# Patient Record
Sex: Male | Born: 1955 | Race: White | Hispanic: No | Marital: Married | Smoking: Never smoker
Health system: Southern US, Community
[De-identification: ages and names within clinical notes are randomized; demographics above are authoritative.]

## PROBLEM LIST (undated history)

## (undated) DIAGNOSIS — I480 Paroxysmal atrial fibrillation: Secondary | ICD-10-CM

## (undated) DIAGNOSIS — G4730 Sleep apnea, unspecified: Secondary | ICD-10-CM

## (undated) DIAGNOSIS — B019 Varicella without complication: Secondary | ICD-10-CM

## (undated) DIAGNOSIS — R972 Elevated prostate specific antigen [PSA]: Secondary | ICD-10-CM

## (undated) DIAGNOSIS — E119 Type 2 diabetes mellitus without complications: Secondary | ICD-10-CM

## (undated) DIAGNOSIS — C801 Malignant (primary) neoplasm, unspecified: Secondary | ICD-10-CM

## (undated) DIAGNOSIS — I10 Essential (primary) hypertension: Secondary | ICD-10-CM

## (undated) DIAGNOSIS — K219 Gastro-esophageal reflux disease without esophagitis: Secondary | ICD-10-CM

## (undated) DIAGNOSIS — E785 Hyperlipidemia, unspecified: Secondary | ICD-10-CM

## (undated) DIAGNOSIS — I4891 Unspecified atrial fibrillation: Secondary | ICD-10-CM

## (undated) HISTORY — PX: PROSTATE BIOPSY: SHX241

## (undated) HISTORY — DX: Paroxysmal atrial fibrillation: I48.0

## (undated) HISTORY — DX: Essential (primary) hypertension: I10

## (undated) HISTORY — DX: Gastro-esophageal reflux disease without esophagitis: K21.9

## (undated) HISTORY — DX: Hyperlipidemia, unspecified: E78.5

## (undated) HISTORY — DX: Varicella without complication: B01.9

## (undated) HISTORY — PX: HERNIA REPAIR: SHX51

## (undated) HISTORY — PX: APPENDECTOMY: SHX54

## (undated) HISTORY — DX: Type 2 diabetes mellitus without complications: E11.9

## (undated) HISTORY — DX: Unspecified atrial fibrillation: I48.91

## (undated) HISTORY — DX: Elevated prostate specific antigen (PSA): R97.20

---

## 2011-02-20 ENCOUNTER — Ambulatory Visit: Payer: Self-pay

## 2012-11-28 ENCOUNTER — Inpatient Hospital Stay: Payer: Self-pay | Admitting: Internal Medicine

## 2012-11-28 LAB — BASIC METABOLIC PANEL
Anion Gap: 7 (ref 7–16)
Calcium, Total: 9.5 mg/dL (ref 8.5–10.1)
Chloride: 104 mmol/L (ref 98–107)
Co2: 23 mmol/L (ref 21–32)
EGFR (African American): >60
EGFR (Non-African Amer.): >60
Glucose: 394 mg/dL — ABNORMAL HIGH (ref 65–99)
Osmolality: 288 (ref 275–301)
Potassium: 4.9 mmol/L (ref 3.5–5.1)

## 2012-11-28 LAB — CBC
HCT: 47.7 % (ref 40.0–52.0)
MCH: 31.6 pg (ref 26.0–34.0)
RBC: 5.19 10*6/uL (ref 4.40–5.90)
RDW: 13.7 % (ref 11.5–14.5)

## 2012-11-28 LAB — URINALYSIS, COMPLETE
Blood: NEGATIVE
Nitrite: NEGATIVE
Ph: 5 (ref 4.5–8.0)
Protein: NEGATIVE
RBC,UR: 1 /HPF (ref 0–5)
Squamous Epithelial: NONE SEEN

## 2012-11-28 LAB — PROTIME-INR
INR: 1
Prothrombin Time: 13.1 secs (ref 11.5–14.7)

## 2012-11-28 LAB — RAPID INFLUENZA A&B ANTIGENS

## 2012-11-28 LAB — DRUG SCREEN, URINE
Amphetamines, Ur Screen: NEGATIVE (ref ?–1000)
Barbiturates, Ur Screen: NEGATIVE (ref ?–200)
Cocaine Metabolite,Ur ~~LOC~~: NEGATIVE (ref ?–300)
MDMA (Ecstasy)Ur Screen: NEGATIVE (ref ?–500)
Methadone, Ur Screen: NEGATIVE (ref ?–300)
Opiate, Ur Screen: POSITIVE (ref ?–300)
Phencyclidine (PCP) Ur S: NEGATIVE (ref ?–25)

## 2012-11-28 LAB — TROPONIN I: Troponin-I: <0.02 ng/mL

## 2012-11-28 LAB — TSH: Thyroid Stimulating Horm: 2.58 u[IU]/mL

## 2012-11-29 DIAGNOSIS — I4891 Unspecified atrial fibrillation: Secondary | ICD-10-CM

## 2012-11-29 DIAGNOSIS — I059 Rheumatic mitral valve disease, unspecified: Secondary | ICD-10-CM

## 2012-11-29 LAB — COMPREHENSIVE METABOLIC PANEL
Albumin: 3.3 g/dL — ABNORMAL LOW (ref 3.4–5.0)
Alkaline Phosphatase: 75 U/L (ref 50–136)
BUN: 20 mg/dL — ABNORMAL HIGH (ref 7–18)
Bilirubin,Total: 1 mg/dL (ref 0.2–1.0)
Creatinine: 0.84 mg/dL (ref 0.60–1.30)
Potassium: 3.8 mmol/L (ref 3.5–5.1)
SGOT(AST): 59 U/L — ABNORMAL HIGH (ref 15–37)
SGPT (ALT): 101 U/L — ABNORMAL HIGH (ref 12–78)
Total Protein: 6.1 g/dL — ABNORMAL LOW (ref 6.4–8.2)

## 2012-11-29 LAB — CBC WITH DIFFERENTIAL/PLATELET
Basophil #: 0.1 10*3/uL (ref 0.0–0.1)
Basophil %: 0.5 %
Eosinophil #: 0.2 10*3/uL (ref 0.0–0.7)
Lymphocyte %: 35.4 %
MCH: 31.7 pg (ref 26.0–34.0)
MCV: 91 fL (ref 80–100)
Monocyte #: 0.7 x10 3/mm (ref 0.2–1.0)
Monocyte %: 6.1 %
Neutrophil #: 6.9 10*3/uL — ABNORMAL HIGH (ref 1.4–6.5)
Neutrophil %: 56.3 %
Platelet: 261 10*3/uL (ref 150–440)
RDW: 14 % (ref 11.5–14.5)
WBC: 12.2 10*3/uL — ABNORMAL HIGH (ref 3.8–10.6)

## 2012-11-29 LAB — TROPONIN I: Troponin-I: <0.02 ng/mL

## 2012-11-29 LAB — LIPID PANEL
Cholesterol: 150 mg/dL (ref 0–200)
HDL Cholesterol: 35 mg/dL — ABNORMAL LOW (ref 40–60)
VLDL Cholesterol, Calc: 28 mg/dL (ref 5–40)

## 2012-12-03 LAB — CULTURE, BLOOD (SINGLE)

## 2012-12-05 ENCOUNTER — Ambulatory Visit (INDEPENDENT_AMBULATORY_CARE_PROVIDER_SITE_OTHER): Payer: 59 | Admitting: Cardiovascular Disease

## 2012-12-05 ENCOUNTER — Encounter: Payer: Self-pay | Admitting: Cardiovascular Disease

## 2012-12-05 VITALS — BP 122/86 | HR 75 | Ht 72.0 in | Wt 236.8 lb

## 2012-12-05 DIAGNOSIS — IMO0001 Reserved for inherently not codable concepts without codable children: Secondary | ICD-10-CM

## 2012-12-05 DIAGNOSIS — I4891 Unspecified atrial fibrillation: Secondary | ICD-10-CM

## 2012-12-05 DIAGNOSIS — E119 Type 2 diabetes mellitus without complications: Secondary | ICD-10-CM | POA: Insufficient documentation

## 2012-12-05 DIAGNOSIS — R002 Palpitations: Secondary | ICD-10-CM

## 2012-12-05 DIAGNOSIS — G4730 Sleep apnea, unspecified: Secondary | ICD-10-CM

## 2012-12-05 DIAGNOSIS — E669 Obesity, unspecified: Secondary | ICD-10-CM

## 2012-12-05 DIAGNOSIS — F1010 Alcohol abuse, uncomplicated: Secondary | ICD-10-CM

## 2012-12-05 MED ORDER — APIXABAN 5 MG PO TABS: 5.0000 mg | ORAL_TABLET | Freq: Two times a day (BID) | ORAL | Status: DC

## 2012-12-05 MED ORDER — GLUCOSE BLOOD VI STRP: ORAL_STRIP | Status: DC

## 2012-12-05 MED ORDER — METOPROLOL TARTRATE 25 MG PO TABS: 25.0000 mg | ORAL_TABLET | Freq: Two times a day (BID) | ORAL | Status: DC

## 2012-12-05 NOTE — Assessment & Plan Note (Signed)
He has changed his lifestyle since his discharge. Reports his sugars have improved

## 2012-12-05 NOTE — Assessment & Plan Note (Signed)
He has significantly changed his diet. Encouraged him to stay on metformin. Followup with primary care physician

## 2012-12-05 NOTE — Progress Notes (Signed)
Patient ID: Brandon [name], male    DOB: [birth year], 57 y.o.   MRN: [id number]  HPI Comments: [name] is a very pleasant 57 year old gentleman presents for followup after recent hospitalization 11/29/2012. History of alcohol, obesity, suspected sleep apnea, presenting with atrial fibrillation. He reported 2 episodes of palpitations and tachycardia with lightheadedness over the previous month. Magnesium was low on arrival. He was started on metoprolol tartrate, anticoagulation with eliquis. Also diagnosed with diabetes type 2 with hemoglobin A1c 10.8. He converted to normal sinus rhythm during his hospital course  Echocardiogram showed ejection fraction 60-65%, normal RV function, mild MR, moderately elevated right ventricular systolic pressures. Liver enzymes were mildly elevated. Glucose 236  Total cholesterol 150, LDL 87, HDL 35  In followup today, he reports that he is doing well but is a little bit groggy. He continues to have problems with snoring, apnea per his wife. Not sleeping well. Uncertain if the medications are making him tired. He denies any tachycardia or palpitations concerning for recurrent atrial fibrillation  EKG today shows normal sinus rhythm with no significant ST or T wave changes    Outpatient Encounter Prescriptions as of 12/05/2012  Medication Sig Dispense Refill  . apixaban (ELIQUIS) 5 MG TABS tablet Take 1 tablet (5 mg total) by mouth 2 (two) times daily.  60 tablet  6  . Ascorbic Acid (VITAMIN C) 1000 MG tablet Take 1,000 mg by mouth daily.      . metFORMIN (GLUCOPHAGE) 500 MG tablet Take 500 mg by mouth 2 (two) times daily with a meal.      . metoprolol tartrate (LOPRESSOR) 25 MG tablet Take 1 tablet (25 mg total) by mouth 2 (two) times daily.  180 tablet  3  . Multiple Vitamins-Minerals (MEGA BASIC PO) Take by mouth.      . ranitidine (ZANTAC) 75 MG tablet Take 75 mg by mouth 2 (two) times daily.      Marland Kitchen glucose blood (ACCU-CHEK ACTIVE STRIPS) test strip Use as  instructed  100 each  12    Review of Systems  Constitutional: Negative.   HENT: Negative.   Eyes: Negative.   Respiratory: Negative.   Cardiovascular: Negative.   Gastrointestinal: Negative.   Endocrine: Negative.   Musculoskeletal: Negative.   Skin: Negative.   Allergic/Immunologic: Negative.   Neurological: Negative.   Hematological: Negative.   Psychiatric/Behavioral: Negative.   All other systems reviewed and are negative.    BP 122/86  Pulse 75  Ht 6' (1.829 m)  Wt 236 lb 12 oz (107.389 kg)  BMI 32.1 kg/m2   Physical Exam  Nursing note and vitals reviewed. Constitutional: He is oriented to person, place, and time. He appears well-developed and well-nourished.  HENT:  Head: Normocephalic.  Nose: Nose normal.  Mouth/Throat: Oropharynx is clear and moist.  Eyes: Conjunctivae are normal. Pupils are equal, round, and reactive to light.  Neck: Normal range of motion. Neck supple. No JVD present.  Cardiovascular: Normal rate, regular rhythm, S1 normal, S2 normal, normal heart sounds and intact distal pulses.  Exam reveals no gallop and no friction rub.   No murmur heard. Pulmonary/Chest: Effort normal and breath sounds normal. No respiratory distress. He has no wheezes. He has no rales. He exhibits no tenderness.  Abdominal: Soft. Bowel sounds are normal. He exhibits no distension. There is no tenderness.  Musculoskeletal: Normal range of motion. He exhibits no edema and no tenderness.  Lymphadenopathy:    He has no cervical adenopathy.  Neurological: He is  alert and oriented to person, place, and time. Coordination normal.  Skin: Skin is warm and dry. No rash noted. No erythema.  Psychiatric: He has a normal mood and affect. His behavior is normal. Judgment and thought content normal.      Assessment and Plan

## 2012-12-05 NOTE — Patient Instructions (Addendum)
You are doing well. No medication changes were made.  If you get a fast rhythm,  Take an extra metoprolol and call the office   Please call us if you have new issues that need to be addressed before your next appt.  Your physician wants you to follow-up in: 6 months.  You will receive a reminder letter in the mail two months in advance. If you don't receive a letter, please call our office to schedule the follow-up appointment.

## 2012-12-05 NOTE — Assessment & Plan Note (Signed)
Maintaining normal sinus rhythm. He is high risk of converting back to atrial fibrillation giving uncontrolled sleep apnea. We will help schedule a sleep study. Encouraged him to stay on anticoagulation and metoprolol twice a day

## 2012-12-05 NOTE — Assessment & Plan Note (Signed)
We recommended he obtain from alcohol as this would be a risk factor for further atrial fibrillation

## 2012-12-05 NOTE — Assessment & Plan Note (Signed)
We will help to schedule a sleep study. He also needs primary care physician

## 2013-04-24 ENCOUNTER — Other Ambulatory Visit: Payer: Self-pay | Admitting: Cardiovascular Disease

## 2013-05-31 ENCOUNTER — Encounter: Payer: Self-pay | Admitting: Cardiovascular Disease

## 2013-05-31 ENCOUNTER — Ambulatory Visit (INDEPENDENT_AMBULATORY_CARE_PROVIDER_SITE_OTHER): Payer: 59 | Admitting: Cardiovascular Disease

## 2013-05-31 ENCOUNTER — Ambulatory Visit: Payer: 59 | Admitting: Cardiovascular Disease

## 2013-05-31 VITALS — BP 120/76 | HR 73 | Ht 72.0 in | Wt 223.5 lb

## 2013-05-31 DIAGNOSIS — E669 Obesity, unspecified: Secondary | ICD-10-CM

## 2013-05-31 DIAGNOSIS — N521 Erectile dysfunction due to diseases classified elsewhere: Secondary | ICD-10-CM

## 2013-05-31 DIAGNOSIS — I4902 Ventricular flutter: Secondary | ICD-10-CM

## 2013-05-31 DIAGNOSIS — F1010 Alcohol abuse, uncomplicated: Secondary | ICD-10-CM

## 2013-05-31 DIAGNOSIS — I498 Other specified cardiac arrhythmias: Secondary | ICD-10-CM

## 2013-05-31 DIAGNOSIS — N529 Male erectile dysfunction, unspecified: Secondary | ICD-10-CM

## 2013-05-31 DIAGNOSIS — G4730 Sleep apnea, unspecified: Secondary | ICD-10-CM

## 2013-05-31 DIAGNOSIS — I4891 Unspecified atrial fibrillation: Secondary | ICD-10-CM

## 2013-05-31 DIAGNOSIS — IMO0001 Reserved for inherently not codable concepts without codable children: Secondary | ICD-10-CM

## 2013-05-31 DIAGNOSIS — E119 Type 2 diabetes mellitus without complications: Secondary | ICD-10-CM | POA: Insufficient documentation

## 2013-05-31 DIAGNOSIS — E1165 Type 2 diabetes mellitus with hyperglycemia: Secondary | ICD-10-CM

## 2013-05-31 DIAGNOSIS — E1169 Type 2 diabetes mellitus with other specified complication: Secondary | ICD-10-CM

## 2013-05-31 MED ORDER — APIXABAN 5 MG PO TABS: 5.0000 mg | ORAL_TABLET | Freq: Two times a day (BID) | ORAL | Status: DC

## 2013-05-31 MED ORDER — ENALAPRIL MALEATE 5 MG PO TABS: 5.0000 mg | ORAL_TABLET | Freq: Every day | ORAL | Status: DC

## 2013-05-31 MED ORDER — TADALAFIL 5 MG PO TABS: 5.0000 mg | ORAL_TABLET | Freq: Every day | ORAL | Status: DC | PRN

## 2013-05-31 NOTE — Progress Notes (Signed)
Patient ID: Brandon [name], male    DOB: 11/26/1955, 58 y.o.   MRN: [id number]  HPI Comments: [name] is a very pleasant 58 year old gentleman with a history of atrial fibrillation, presents for followup     hospitalization 11/29/2012. History of alcohol, obesity, suspected sleep apnea, presenting with atrial fibrillation.  Magnesium was low on arrival. He was started on metoprolol tartrate, anticoagulation with eliquis. Also diagnosed with diabetes type 2 with hemoglobin A1c 10.8. He converted to normal sinus rhythm during his hospital course  Echocardiogram showed ejection fraction 60-65%, normal RV function, mild MR, moderately elevated right ventricular systolic pressures. Liver enzymes were mildly elevated. Glucose 236  Total cholesterol 150, LDL 87, HDL 35  In followup since that time, he has lost significant weight, eating better, started on metformin. He reports hemoglobin A1c 6.2 He denies any tachycardia or palpitations concerning for atrial fibrillation. He is taking metoprolol 12.5 mg twice a day He is concerned with recent symptoms of erectile dysfunction Otherwise feels well with good energy and no other complaints,  EKG today shows normal sinus rhythm with rate 73 beats per minute, no significant ST or T wave changes    Outpatient Encounter Prescriptions as of 05/31/2013  Medication Sig  . apixaban (ELIQUIS) 5 MG TABS tablet Take 1 tablet (5 mg total) by mouth 2 (two) times daily.  . Ascorbic Acid (VITAMIN C) 1000 MG tablet Take 1,000 mg by mouth daily.  Marland Kitchen glucose blood (ACCU-CHEK ACTIVE STRIPS) test strip Use as instructed  . metFORMIN (GLUCOPHAGE) 500 MG tablet Take 500 mg by mouth 2 (two) times daily with a meal.  . metoprolol tartrate (LOPRESSOR) 25 MG tablet TAKE 1 TABLET BY MOUTH TWICE DAILY  . Multiple Vitamins-Minerals (MEGA BASIC PO) Take by mouth.  . ranitidine (ZANTAC) 75 MG tablet Take 75 mg by mouth 2 (two) times daily.    Review of Systems  Constitutional:  Negative.   HENT: Negative.   Eyes: Negative.   Respiratory: Negative.   Cardiovascular: Negative.   Gastrointestinal: Negative.   Endocrine: Negative.   Musculoskeletal: Negative.   Skin: Negative.   Allergic/Immunologic: Negative.   Neurological: Negative.   Hematological: Negative.   Psychiatric/Behavioral: Negative.   All other systems reviewed and are negative.    BP 120/76  Pulse 73  Ht 6' (1.829 m)  Wt 223 lb 8 oz (101.379 kg)  BMI 30.31 kg/m2  Physical Exam  Nursing note and vitals reviewed. Constitutional: He is oriented to person, place, and time. He appears well-developed and well-nourished.  HENT:  Head: Normocephalic.  Nose: Nose normal.  Mouth/Throat: Oropharynx is clear and moist.  Eyes: Conjunctivae are normal. Pupils are equal, round, and reactive to light.  Neck: Normal range of motion. Neck supple. No JVD present.  Cardiovascular: Normal rate, regular rhythm, S1 normal, S2 normal, normal heart sounds and intact distal pulses.  Exam reveals no gallop and no friction rub.   No murmur heard. Pulmonary/Chest: Effort normal and breath sounds normal. No respiratory distress. He has no wheezes. He has no rales. He exhibits no tenderness.  Abdominal: Soft. Bowel sounds are normal. He exhibits no distension. There is no tenderness.  Musculoskeletal: Normal range of motion. He exhibits no edema and no tenderness.  Lymphadenopathy:    He has no cervical adenopathy.  Neurological: He is alert and oriented to person, place, and time. Coordination normal.  Skin: Skin is warm and dry. No rash noted. No erythema.  Psychiatric: He has a normal mood and  affect. His behavior is normal. Judgment and thought content normal.      Assessment and Plan

## 2013-05-31 NOTE — Assessment & Plan Note (Signed)
Complemented him on his recent weight loss

## 2013-05-31 NOTE — Patient Instructions (Signed)
You are doing well.  Please start enalapril 5 mg daily  Please call us if you have new issues that need to be addressed before your next appt.  Your physician wants you to follow-up in: 6 months.  You will receive a reminder letter in the mail two months in advance. If you don't receive a letter, please call our office to schedule the follow-up appointment.

## 2013-05-31 NOTE — Assessment & Plan Note (Signed)
Appears to be maintaining normal sinus rhythm. No medication changes made at this time. We did discuss potentially coming off anticoagulation in the future. We'll continue current medications for now

## 2013-05-31 NOTE — Assessment & Plan Note (Signed)
Sample of Cialis given given a rectal dysfunction symptoms.

## 2013-05-31 NOTE — Assessment & Plan Note (Signed)
Alcohol abuse was discussed with him today. This was a prior issue

## 2013-05-31 NOTE — Assessment & Plan Note (Addendum)
Diabetes numbers much improved. Encouraged him to continue his current plan Enalapril 5 mg daily has been added for his diabetes

## 2013-05-31 NOTE — Assessment & Plan Note (Signed)
He is currently wearing his CPAP and sleeping much better

## 2013-07-21 ENCOUNTER — Other Ambulatory Visit: Payer: Self-pay | Admitting: Cardiovascular Disease

## 2013-08-01 DIAGNOSIS — K219 Gastro-esophageal reflux disease without esophagitis: Secondary | ICD-10-CM | POA: Insufficient documentation

## 2013-08-01 DIAGNOSIS — I10 Essential (primary) hypertension: Secondary | ICD-10-CM | POA: Insufficient documentation

## 2013-10-17 ENCOUNTER — Ambulatory Visit: Payer: Self-pay | Admitting: Urology

## 2014-01-27 ENCOUNTER — Ambulatory Visit: Payer: 59 | Admitting: Cardiovascular Disease

## 2014-02-14 ENCOUNTER — Ambulatory Visit (INDEPENDENT_AMBULATORY_CARE_PROVIDER_SITE_OTHER): Payer: 59 | Admitting: Cardiovascular Disease

## 2014-02-14 ENCOUNTER — Encounter: Payer: Self-pay | Admitting: Cardiovascular Disease

## 2014-02-14 VITALS — BP 106/68 | HR 75 | Ht 72.0 in | Wt 220.5 lb

## 2014-02-14 DIAGNOSIS — I4891 Unspecified atrial fibrillation: Secondary | ICD-10-CM

## 2014-02-14 DIAGNOSIS — IMO0001 Reserved for inherently not codable concepts without codable children: Secondary | ICD-10-CM

## 2014-02-14 DIAGNOSIS — R42 Dizziness and giddiness: Secondary | ICD-10-CM | POA: Insufficient documentation

## 2014-02-14 DIAGNOSIS — G4730 Sleep apnea, unspecified: Secondary | ICD-10-CM

## 2014-02-14 DIAGNOSIS — E1165 Type 2 diabetes mellitus with hyperglycemia: Secondary | ICD-10-CM

## 2014-02-14 DIAGNOSIS — E669 Obesity, unspecified: Secondary | ICD-10-CM

## 2014-02-14 DIAGNOSIS — F1010 Alcohol abuse, uncomplicated: Secondary | ICD-10-CM

## 2014-02-14 NOTE — Assessment & Plan Note (Signed)
Rare episodes of dizziness. Blood pressure is low on today's visit. I suspect his symptoms are secondary to orthostasis. Recommended he liberalize his salt and fluid intake. If symptoms persist, may need to move the enalapril to the evening workup the pill in half

## 2014-02-14 NOTE — Assessment & Plan Note (Signed)
Dramatic improvement in his weight since 2014.

## 2014-02-14 NOTE — Patient Instructions (Signed)
You are doing well. No medication changes were made.  Try to hydrate, don't avoid salt (pressure is low) Ok to move enalapril to the PM Stay on eliquis and low dose metoprolol (pm)  If you have worsening dizziness, call the office  Please call us if you have new issues that need to be addressed before your next appt.  Your physician wants you to follow-up in: 6 months.  You will receive a reminder letter in the mail two months in advance. If you don't receive a letter, please call our office to schedule the follow-up appointment.

## 2014-02-14 NOTE — Assessment & Plan Note (Signed)
No symptoms concerning for atrial fibrillation. He will continue on metoprolol in the evening, anticoagulation

## 2014-02-14 NOTE — Assessment & Plan Note (Signed)
Currently compliant with his CPAP

## 2014-02-14 NOTE — Assessment & Plan Note (Signed)
Diabetes is well controlled. Suggested he continue with his exercise, weight loss program Managed by primary care

## 2014-02-14 NOTE — Assessment & Plan Note (Signed)
Recommended moderation with alcohol

## 2014-02-14 NOTE — Progress Notes (Signed)
Patient ID: Brandon [name], male    DOB: 1955-06-23, 58 y.o.   MRN: [id number]  HPI Comments: Brandon [name] is a very pleasant 58 year old gentleman with a history of atrial fibrillation, presents for followup of his atrial fibrillation He has a history of sleep apnea, wears CPAP on a regular basis  In follow-up today, he reports that he has been doing well, denies having any palpitations concerning for atrial fibrillation. He had significant fatigue and stopped his morning metoprolol, now takes metoprolol 2.5 mg in the p.m. and feels much better. Occasionally has lightheadedness when he stands up quickly. He has been avoiding salt, does not drink much during the daytime. Labs reviewed with him showing Total cholesterol 188, hemoglobin A1c 6.1  feels well with good energy and no other complaints, Weight continues to slowly trend lower  EKG on today's visit shows normal sinus rhythm with rate 75 bpm, no significant ST or T-wave changes  Prior medical history  hospitalization 11/29/2012. History of alcohol, obesity, suspected sleep apnea, presenting with atrial fibrillation.  Magnesium was low on arrival. He was started on metoprolol tartrate, anticoagulation with eliquis. Also diagnosed with diabetes type 2 with hemoglobin A1c 10.8. He converted to normal sinus rhythm during his hospital course  Echocardiogram showed ejection fraction 60-65%, normal RV function, mild MR, moderately elevated right ventricular systolic pressures. Liver enzymes were mildly elevated. Glucose 236   Allergies  Allergen Reactions  . Penicillin G Anaphylaxis  . Penicillins     Outpatient Encounter Prescriptions as of 02/14/2014  Medication Sig  . apixaban (ELIQUIS) 5 MG TABS tablet Take 1 tablet (5 mg total) by mouth 2 (two) times daily.  . Ascorbic Acid (VITAMIN C) 1000 MG tablet Take 1,000 mg by mouth daily.  Marland Kitchen ELIQUIS 5 MG TABS tablet TAKE 1 TABLET BY MOUTH TWICE DAILY  . enalapril (VASOTEC) 5 MG tablet Take  1 tablet (5 mg total) by mouth daily.  Marland Kitchen glucose blood (ACCU-CHEK ACTIVE STRIPS) test strip Use as instructed  . metFORMIN (GLUCOPHAGE) 500 MG tablet Take 500 mg by mouth 2 (two) times daily with a meal.  . metoprolol tartrate (LOPRESSOR) 25 MG tablet TAKE 1 TABLET BY MOUTH TWICE DAILY  . Multiple Vitamins-Minerals (MEGA BASIC PO) Take by mouth.  . ranitidine (ZANTAC) 75 MG tablet Take 75 mg by mouth 2 (two) times daily.  . tadalafil (CIALIS) 5 MG tablet Take 1 tablet (5 mg total) by mouth daily as needed for erectile dysfunction.    Past Medical History  Diagnosis Date  . GERD (gastroesophageal reflux disease)   . Type 2 diabetes mellitus   . A-fib   . Hypertension     Past Surgical History  Procedure Laterality Date  . Appendectomy    . Hernia repair      Social History  reports that he has quit smoking. He does not have any smokeless tobacco history on file. He reports that he drinks alcohol. He reports that he does not use illicit drugs.  Family History Family history is unknown by patient.     Review of Systems  Constitutional: Negative.   HENT: Negative.   Eyes: Negative.   Respiratory: Negative.   Cardiovascular: Negative.   Gastrointestinal: Negative.   Endocrine: Negative.   Musculoskeletal: Negative.   Skin: Negative.   Allergic/Immunologic: Negative.   Neurological: Positive for dizziness.  Hematological: Negative.   Psychiatric/Behavioral: Negative.   All other systems reviewed and are negative.   BP 106/68 mmHg  Pulse 75  Ht 6' (1.829 m)  Wt 220 lb 8 oz (100.018 kg)  BMI 29.90 kg/m2  Physical Exam  Constitutional: He is oriented to person, place, and time. He appears well-developed and well-nourished.  HENT:  Head: Normocephalic.  Nose: Nose normal.  Mouth/Throat: Oropharynx is clear and moist.  Eyes: Conjunctivae are normal. Pupils are equal, round, and reactive to light.  Neck: Normal range of motion. Neck supple. No JVD present.   Cardiovascular: Normal rate, regular rhythm, S1 normal, S2 normal, normal heart sounds and intact distal pulses.  Exam reveals no gallop and no friction rub.   No murmur heard. Pulmonary/Chest: Effort normal and breath sounds normal. No respiratory distress. He has no wheezes. He has no rales. He exhibits no tenderness.  Abdominal: Soft. Bowel sounds are normal. He exhibits no distension. There is no tenderness.  Musculoskeletal: Normal range of motion. He exhibits no edema or tenderness.  Lymphadenopathy:    He has no cervical adenopathy.  Neurological: He is alert and oriented to person, place, and time. Coordination normal.  Skin: Skin is warm and dry. No rash noted. No erythema.  Psychiatric: He has a normal mood and affect. His behavior is normal. Judgment and thought content normal.      Assessment and Plan   Nursing note and vitals reviewed.

## 2014-03-31 ENCOUNTER — Other Ambulatory Visit: Payer: Self-pay | Admitting: Cardiovascular Disease

## 2014-05-30 ENCOUNTER — Encounter: Payer: Self-pay | Admitting: *Deleted

## 2014-05-30 ENCOUNTER — Other Ambulatory Visit: Payer: Self-pay | Admitting: Cardiovascular Disease

## 2014-06-27 NOTE — H&P (Signed)
PATIENT NAME:  Brandon [name], Brandon [name] MR#:  [id number] DATE OF BIRTH:  05-09-1955  PRIMARY CARE PHYSICIAN: The patient does not have one.   REFERRING PHYSICIAN: Hinda Kehr, MD   CHIEF COMPLAINT: "Fluttering of the heart," palpitations.   HISTORY OF PRESENT ILLNESS: This is a very nice 59 year old gentleman who states that he has been healthy all of his life other than some GERD. Comes today with a history of going to work, being his normal self. Around noon going to lunch, he started having some fluttering of his heart and feeling lightheaded.   The patient states that after he came from lunch and sat down, he felt some relief of the symptoms to the point that they almost went away but not completely. He got up and went back to work and the fluttering started again. 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   The nurse told him that his heart rate was really elevated and he needed to be seen by a doctor. The patient came to the ER and he was found to be in significant tachycardia with a heart rate in the 160s.   The EKG showed a rhythm that looked like SVT. After one dose of Cardizem, it slowed down enough to notice the patient is in atrial fibrillation.   The patient is admitted for control of these symptoms. He received at least 45 mg IV of Cardizem and his heart rate has not sustainably improved. The heart rate comes down to the 120s and 110, and then it spikes back up in the 160s after the Cardizem wears off.   The patient is going to be admitted to the critical care unit with Cardizem drip. If that does not work, will change to amiodarone.   REVIEW OF SYSTEMS: Twelve systems reviewed. CONSTITUTIONAL: The patient denies any fever up until now. He developed a 101 temperature here in the ER. The patient denies any fatigue, weakness, weight loss or weight gain. He states that for the past week he is having some upper respiratory symptoms  that were relieved after he was taking some guaifenesin.  ENT: No difficulty swallowing. No nasal congestion. No epistaxis.  RESPIRATORY: Had cough last week, now resolved. No wheezing. No hemoptysis. No significant COPD.  CARDIOVASCULAR: No chest pain. No orthopnea. No edemas. No syncope. Positive palpitations today and 1 month ago. They relieved by themselves. No other episodes.  GASTROINTESTINAL: No nausea, vomiting, abdominal pain, constipation or diarrhea.  GENITOURINARY: No dysuria, hematuria, changes in frequency, and no prostatitis.  ENDOCRINE: No polyuria, polydipsia, polyphagia, cold or heat intolerance. The patient states that he is not diabetic, but now we find out that his glucose is above 300.  HEMATOLOGIC AND LYMPHATIC: No anemia, easy bruising or bleeding.  SKIN: No rashes, petechiae or moles that are changing.  MUSCULOSKELETAL: No significant neck pain, back pain or gout.  NEUROLOGIC: No numbness, tingling, CVAs or TIAs.  PSYCHIATRIC: No significant insomnia or depression.   PAST MEDICAL HISTORY: GERD. The patient denies any other medical problems.   ALLERGIES: PENICILLIN GIVES HIM HIVES.   PAST SURGICAL HISTORY: Appendectomy and hernioplasty.   MEDICATIONS: Multivitamins, vitamin C, and ranitidine 75 mg twice daily or as needed.   FAMILY HISTORY: No CVA. No MI. No CHF. Positive for ovarian cancer in his mother and lung cancer and prostate cancer in his father.   SOCIAL HISTORY: The patient used to smoke. He smoked intermittently through  his life, but he quit in 1997. He never smoked more than 1/2 pack a day. Alcohol: He drinks 3 to 4 beers on Friday and Saturday but no more during the weekdays. He does not use any drugs. He drinks 2 cups of coffee in the morning. No other caffeinated drinks. He lives with his wife. He works as a Associate Professor in a Scientist, clinical (histocompatibility and immunogenetics).   PHYSICAL EXAMINATION:  VITAL SIGNS: Blood pressure of 146/91, heart rate up to 161, respiratory rate 16,  temperature 101 on admission, oral; after Tylenol, 98.9. Oxygen saturation 94% on room air.  GENERAL: Alert, oriented x3, in no acute distress. No respiratory distress. Hemodynamically stable. No significant stress at all. He looks very comfortable.  HEENT: His pupils are equal and reactive. Extraocular movements are intact. Mucosae are moist. Anicteric sclerae. Pink conjunctivae. No oral lesions. No oropharyngeal exudates.  NECK: Supple. No JVD. No thyromegaly. No adenopathy. No carotid bruits. No rigidity.  CARDIOVASCULAR: Irregularly irregular. Fast heart rate in the 160s. He has no displacement of PMI.  LUNGS: Clear without any wheezing or crepitus. No use of accessory muscles.  ABDOMEN: Soft, nontender, nondistended. No hepatosplenomegaly. No masses. Bowel sounds are positive.  GENITAL: Deferred.  EXTREMITIES: No edema, cyanosis or clubbing. Pulses +2. Capillary refill less than 3. Bevelyn Buckles is negative bilaterally.  VASCULAR: Pulses +2. Capillary refill less than 3. Normal pulses, they are strong.  NEUROLOGIC: Cranial nerves II through XII grossly intact. The strength is 5/5 in all four extremities.  PSYCHIATRIC: Negative for significant anxiety, depression, or agitation. Mood is normal. Insight is adequate.  SKIN: No rashes or petechiae.  LYMPHATIC: Negative for lymphadenopathy in neck or supraclavicular areas.  MUSCULOSKELETAL: No significant joint deformities or abnormalities.   DIAGNOSTIC STUDIES: Glucose is 394. BUN is 21. Creatinine 1.03. Sodium 134. Troponin 0.02. White blood cells 13.2. Hemoglobin is 16.4. Platelet count 300. INR 1.   Chest x-ray: Negative for significant abnormalities.   EKG: Atrial fibrillation with RVR.   ASSESSMENT AND PLAN: A 59 year old gentleman with history of being healthy, only gastroesophageal reflux disease. He comes today with atrial fibrillation.  1.  Atrial fibrillation with rapid ventricular response. The patient is admitted to critical care unit.  His blood pressure was borderline low at the beginning, now is high. It is fluctuating. He received at least 45 mg IV of diltiazem. He also received some diltiazem p.o. up to 120 mg. His heart rate had slowed down, but now it is back up again. The patient has a CHADS score of 1 for diabetes, but we do not know if he has any valvulopathy or any other issues, for what we are going to temporarily anticoagulate him with Lovenox 1 mg/kg every 12 hours and wait for a full evaluation. Echocardiogram has been ordered.  2.  The patient has a fever of 101, for what it could be a trigger for the atrial fibrillation. We are going to polyculture him.  3.  The patient does not have any history of a structural heart disease. He is going to be on telemetry, and he is going to be started on a Cardizem drip. If the Cardizem drip does not work, we are going to do amiodarone. I think with this patient it is actually a good idea to do amiodarone for reversal of the rhythm if possible. The patient could be cardioverted if his echocardiogram is okay, maybe a TEE could be done as well. He is a good candidate for cardioversion. Cardiology consult obtained.  4.  Diabetes. The patient did not know he was diabetic. His blood sugars are in the 400s.  IV insulin given x16 units before his meal and then he is on sliding scale.  The patient will benefit from long lasting insulin like Lantus. At this moment, we are going to see how he is going to react. Possibly start Lantus in the morning if his blood sugars are still really elevated.  5.  Hypertension. The patient is not taking anything for high blood pressures. For now, we are going to treat him with Cardizem. Consider beta blocker like labetalol p.r.n. and metoprolol p.o. for control of his rhythm.  6.  Deep vein thrombosis prophylaxis with Lovenox. Gastrointestinal prophylaxis with Protonix. The patient is a FULL CODE. The patient at risk of cardiovascular collapse due to his severe  arrhythmia. He is on the CCU on a Cardizem drip.   CRITICAL CARE TIME: 50 minutes.    ____________________________ Hatteras Sink, MD rsg:np D: 11/28/2012 20:18:46 ET T: 11/28/2012 21:39:45 ET JOB#: [id number]  cc: Adelphi Sink, MD, <Dictator> Vernesha Talbot America Brown MD ELECTRONICALLY SIGNED 11/29/2012 11:11

## 2014-06-27 NOTE — Discharge Summary (Signed)
PATIENT NAME:  [name], Brandon MR#:  [id number] DATE OF BIRTH:  1955-11-03  DATE OF ADMISSION:  11/28/2012 DATE OF DISCHARGE:  11/29/2012  PRESENTING COMPLAINT: Palpitations.   DISCHARGE DIAGNOSES:  1. Acute rapid atrial fibrillation, new onset.  2. New-onset type 2 diabetes.  3. Gastroesophageal reflux disease.  CONDITION ON DISCHARGE: Stable. Heart rate in the 70s, sinus rhythm.   CODE STATUS: Full code.   MEDICATIONS:  1. Vitamin C 1 tablet daily.  2. MegaRed tablet daily.  3. Ranitidine 75 mg daily as needed.  4. Eliquis 5 mg b.i.d. for anticoagulation.  5. Metformin 500 mg b.i.d.  6. Metoprolol tartrate 25 mg b.i.d.   DIET: Low sodium, ADA 1800-calorie diet.   FOLLOWUP: With Dr. Rockey Situ in 1 to 2 weeks. The patient advised to get primary care physician in the area.   DIAGNOSTIC DATA:  Cardiac enzymes x3 remained negative.  Echo Doppler: Left ventricular ejection fraction of 60% to 65%, normal ventricular systolic function. Mild mitral regurgitation. Mildly elevated pulmonary artery systolic pressure. Comprehensive metabolic panel within normal limits except SGOT of 59, SGPT of 101, glucose is 236. The patient's albumin is 3.3.  Hemoglobin A1c is 10.8.  Lipid profile within normal limits.  UA negative for UTI.  Urine drug screen positive for opiates.  Blood cultures negative in 18 to 24 hours.   CONSULTATION: Cardiology consultation with Dr. Rockey Situ.   BRIEF SUMMARY OF HOSPITAL COURSE: Brandon [name] is a 59 year old Caucasian gentleman with history of acid reflux who comes in with palpitations. He was admitted with:   1. Atrial fibrillation with rapid ventricular response. He was given IV Cardizem in the Emergency Room, started on Cardizem drip, placed in the CCU overnight. He converted back to sinus rhythm. Heart rate was in the 70s. The patient has CHADS score of 1. Dr. Rockey Situ recommends to start Eliquis 5 mg b.i.d. for anticoagulation. Likely will be discontinued as outpatient  once the patient remains in sinus rhythm. Echo shows EF of 55% to 60%. He was changed to p.o. metoprolol 25 b.i.d. Heart rate remained stable.  2. New-onset type 2 diabetes. Hemoglobin A1c was 10. The patient was started on p.o. metformin b.i.d. Diet, exercise and lifestyle changes were discussed with the patient. Glucometer was prescribed. The patient was advised to keep log of sugar checks. He was also advised to get primary care physician in the area for followup of his diabetes as well.  3. Hypertension. The patient was not taking anything for high blood pressures. He is on metoprolol, and blood pressure remained stable.  4. Gastroesophageal reflux disease, on p.o. ranitidine.   Hospital stay otherwise remained stable.   CODE STATUS: The patient remained a full code.    TIME SPENT: 40 minutes.    ____________________________ Hart Rochester Posey Pronto, MD sap:OSi D: 11/30/2012 07:33:00 ET T: 11/30/2012 07:49:24 ET JOB#: [id number]  cc: Veverly Larimer A. Posey Pronto, MD, <Dictator> Minna Merritts, MD Ilda Basset MD ELECTRONICALLY SIGNED 12/04/2012 14:13

## 2014-06-27 NOTE — Consult Note (Signed)
General Aspect Brandon [name] is a 59 yo Caucasian male w/ no prior cardiac history, minimal known PMHx including reflux/indigestion, chewing tobacco abuse and obesity (no PMD) who was admitted to [facility] yesterday for newly diagnosed atrial fibrillation/flutter with rapid rates. Cardiology was consulted for atrial fibrillation.  He reports no prior cardiac history, denies h/o MI, chest pain, stress test or cardiac catheterization. About 1 month ago, he remembers an episode of tachy-palpitations and lightheadedness lasting 30-40 min and alleviating spontaneously. He was in his USOH yesterday morning. He went to work and shortly after experienced a similar episode of tachy-palpitations with more severe lightheadedness/presyncope. He attempted to rest w/o relief. He was evaluated by the nurse at work and HR was noted to be 160 bpm. He was advised to present to the ED. Upon walking from the reception to his room, he did experience several minutes of substernal chest pressure w/o radiation. He denies recent DOE/SOB, PND, orthopnea, LE edema or syncope. No abnormal bleeding. No fevers, chills. He does report having "a cold" ~ 1 week ago.  He does snore at night and wakes up gasping for breath. He chews tobacco, drinks 0-93 alcoholic drinks Fri/Sat. No illicit drug use. No family h/o cardiac disease. He has minimal prior medical history. He does not follow-up with a PCP.   Present Illness In the ED, EKG indicated atrial flutter, 2:1. Initial CEs WNL. CXR w/o acute cardiopulmonary disease. BMET- glu 394, Na 134 otherwise unremarkable. CBC- WBC 13.2K, otherwise WNL. D-dimer, TSH WNL. Mg 1.7. Influenza A/B antigens negative. U/a unremarkable. Bld cx x 2 NGTD. He was started on dilt gtt. Repeat EKG indicated a-fib with RVR. He converted to NSR ~ 0330 today. Lopressor $RemoveBeforeDE'25mg'[id number]$  PO added. Hgb A1C 10.8%. LDL 87, HDL 35, TC 150, TG 141. Repeat TnI WNL.  PAST MEDICAL HISTORY: GERD. The patient denies any other medical problems.    ALLERGIES: PENICILLIN GIVES HIM HIVES.   PAST SURGICAL HISTORY: Appendectomy and hernioplasty.   FAMILY HISTORY: No CVA. No MI. No CHF. Positive for ovarian cancer in his mother and lung cancer and prostate cancer in his father.   SOCIAL HISTORY: used to smoke. He smoked intermittently through his life, but he quit in 1997. He never smoked more than 1/2 pack a day. Alcohol: He drinks 3 to 4 beers on Friday and Saturday but no more during the weekdays. He does not use any drugs. He drinks 2 cups of coffee in the morning. No other caffeinated drinks. He lives with his wife. He works as a Associate Professor in a Scientist, clinical (histocompatibility and immunogenetics).   Physical Exam:  GEN no acute distress, obese   HEENT pink conjunctivae, PERRL, hearing intact to voice   NECK supple  No masses  trachea midline   RESP normal resp effort  clear BS  no use of accessory muscles  no wheezes, rales or rhonchi   CARD Regular rate and rhythm  Normal, S1, S2  No murmur   ABD denies tenderness  soft  normal BS   EXTR negative cyanosis/clubbing, negative edema   SKIN normal to palpation, skin turgor good   NEURO follows commands, motor/sensory function intact   PSYCH alert, A+O to time, place, person   Review of Systems:  Subjective/Chief Complaint palpitations, lightheadedness   General: No Complaints    Skin: No Complaints    ENT: No Complaints    Eyes: No Complaints    Neck: No Complaints    Respiratory: No Complaints    Cardiovascular: Tightness  Palpitations  Gastrointestinal: No Complaints    Genitourinary: No Complaints    Vascular: No Complaints    Musculoskeletal: No Complaints    Neurologic: Dizzness   Hematologic: No Complaints    Endocrine: No Complaints    Psychiatric: No Complaints    Review of Systems: All other systems were reviewed and found to be negative   Medications/Allergies Reviewed Medications/Allergies reviewed    Home Medications: Medication Instructions Status  Vitamin C  1 tab(s) orally once a day Active  Mega Red tablet 1 tab(s) orally once a day Active  ranitidine 75 mg oral tablet 1 tab(s) orally once a day, As Needed - for Indigestion, Heartburn Active   Lab Results:  Thyroid:  24-Sep-14 18:13   Thyroid Stimulating Hormone 2.58 (0.45-4.50 (International Unit)  ----------------------- Pregnant patients have  different reference  ranges for TSH:  - - - - - - - - - -  Pregnant, first trimetser:  0.36 - 2.50 uIU/mL)  Hepatic:  25-Sep-14 02:19   Bilirubin, Total 1.0  Alkaline Phosphatase 75  SGPT (ALT)  101  SGOT (AST)  59  Total Protein, Serum  6.1  Albumin, Serum  3.3  Routine Chem:  24-Sep-14 18:13   Magnesium, Serum - (1.8-2.4 THERAPEUTIC RANGE: 4-7 mg/dL TOXIC: > 10 mg/dL  -----------------------)    20:14   Magnesium, Serum  1.7 (1.8-2.4 THERAPEUTIC RANGE: 4-7 mg/dL TOXIC: > 10 mg/dL  -----------------------)  25-Sep-14 02:19   Glucose, Serum  236  BUN  20  Creatinine (comp) 0.84  Sodium, Serum 136  Potassium, Serum 3.8  Chloride, Serum 105  CO2, Serum 28  Calcium (Total), Serum 8.5  Osmolality (calc) 282  eGFR (African American) >60  eGFR (Non-African American) >60 (eGFR values <52mL/min/1.73 m2 may be an indication of chronic kidney disease (CKD). Calculated eGFR is useful in patients with stable renal function. The eGFR calculation will not be reliable in acutely ill patients when serum creatinine is changing rapidly. It is not useful in  patients on dialysis. The eGFR calculation may not be applicable to patients at the low and high extremes of body sizes, pregnant women, and vegetarians.)  Anion Gap  3  Hemoglobin A1c ([facility])  10.8 (The American Diabetes Association recommends that a primary goal of therapy should be <7% and that physicians should reevaluate the treatment regimen in patients with HbA1c values consistently >8%.)  Cholesterol, Serum 150  Triglycerides, Serum 141  HDL (INHOUSE)  35  VLDL Cholesterol  Calculated 28  LDL Cholesterol Calculated 87 (Result(s) reported on 29 Nov 2012 at 03:24AM.)  Cardiac:  24-Sep-14 18:13   Troponin I < 0.02 (0.00-0.05 0.05 ng/mL or less: NEGATIVE  Repeat testing in 3-6 hrs  if clinically indicated. >0.05 ng/mL: POTENTIAL  MYOCARDIAL INJURY. Repeat  testing in 3-6 hrs if  clinically indicated. NOTE: An increase or decrease  of 30% or more on serial  testing suggests a  clinically important change)  CK, Total 146  CPK-MB, Serum 2.2  25-Sep-14 02:19   Troponin I 0.02 (0.00-0.05 0.05 ng/mL or less: NEGATIVE  Repeat testing in 3-6 hrs  if clinically indicated. >0.05 ng/mL: POTENTIAL  MYOCARDIAL INJURY. Repeat  testing in 3-6 hrs if  clinically indicated. NOTE: An increase or decrease  of 30% or more on serial  testing suggests a  clinically important change)  Routine Coag:  24-Sep-14 20:57   D-Dimer, Quantitative < 0.22 (If the D-dimer test is being used to assist in the exclusion of DVT and/or PE, note the following:  In various studies concerning the D-dimer methodology (STA Liatest) in use by this laboratory, it has been reported that with a cut-off value of 0.50 ug/mL FEU, the  negative predictive value regarding the exclusion of thrombosis is within the 95-100% range.  In patients with high pre-test probability of DVT/PE the results of the D-dimer test should be correlated with other diagnostic and clinical assessment modalities." Reference: [employer]., 2005.)  Routine Hem:  25-Sep-14 02:19   WBC (CBC)  12.2  RBC (CBC) 4.68  Hemoglobin (CBC) 14.9  Hematocrit (CBC) 42.7  Platelet Count (CBC) 261  MCV 91  MCH 31.7  MCHC 34.8  RDW 14.0  Neutrophil % 56.3  Lymphocyte % 35.4  Monocyte % 6.1  Eosinophil % 1.7  Basophil % 0.5  Neutrophil #  6.9  Lymphocyte #  4.3  Monocyte # 0.7  Eosinophil # 0.2  Basophil # 0.1 (Result(s) reported on 29 Nov 2012 at 03:31AM.)   EKG:  Interpretation EKG shows atrial flutter, 2:1  conduction, no ST/T changes, follow up EKG with atrial fibrillation   Rate 151   Repeat EKG Repeat EKG  atrial fibrillation w/ RVR, RAD   Rate 131   Radiology Results:  XRay:    24-Sep-14 18:39, Chest Portable Single View  Chest Portable Single View   REASON FOR EXAM:    Chest Pain  COMMENTS:       PROCEDURE: DXR - DXR PORTABLE CHEST SINGLE VIEW  - Nov 28 2012  6:39PM     RESULT: The lungs are clear. The heart and pulmonary vessels are normal.   The bony and mediastinal structures are unremarkable. There is no   effusion. There is no pneumothorax or evidence of congestive failure.    IMPRESSION:  No acute cardiopulmonary disease.    Dictation Site: 6        Verified By: Sundra Aland, M.D., MD    Penicillin: Anaphylaxis  Vital Signs/Nurse's Notes: **Vital Signs.:   25-Sep-14 07:00  Vital Signs Type Routine  Temperature Temperature (F) 97.6  Celsius 36.4  Temperature Source oral  Pulse Pulse 58  Respirations Respirations 11  Systolic BP Systolic BP 169  Diastolic BP (mmHg) Diastolic BP (mmHg) 61  Mean BP 75  Pulse Ox % Pulse Ox % 93  Oxygen Delivery Room Air/ 21 %  Pulse Ox Heart Rate 58    Impression 59 yo Caucasian male w/ no prior cardiac history, minimal known PMHx including reflux/indigestion, chewing tobacco abuse and obesity who was admitted to [facility] yesterday for newly diagnosed atrial fibrillation/flutter with rapid rates.   1. Newly diagnosed atrial fibrillation/flutter with rapid rates/RVR  two episodes of tachy-palpitations and lightheadedness over the past month attributed to atrial flutter/fibrillation. history of snoring/apneic episodes, obesity.  Suspect undiagnosed OSA has provided predisposition to developing these rhythms. Also ETOH, possible HTN, low magnesium. Rate-control and subsequent conversion to NSR achieved with AVN blockers alone.  -- Agree with metoprolol tartrate 25mg  PO BID, would take extra dose of metoprolol for  tachycardia/palpiations as an outpt -- Add Eliquis 5mg  BID for anticoagulation. Coupon has been provided for free month --We can help facilitate a sleep study, monitor BP as an outpt, minimize ETOH (currently drinks heavily on weekends)  2. Newly diagnosed type 2 DM Hgb A1C 10.8%. Hyperglycemic on admission.  -- Management per primary team, started on metformin. Discussed diet changes with him.   3. Snoring/apneic episodes -- outpt sleep study  4. Indigestion/GERD -- Continue H2 blocker/add PPI  5.  Obesity -- Encouraged lifestyle modifications in diet & exercise for risk factor reduction.  Moderate EtOH intake.   6. Tobacco abuse Chews. Unwilling to quit today.  7. Leukocytosis Work-up by primary team reveals no evidence of infection.   8. Hypomagnesemia -- Replete  9) ETOH: encouraged to decrease weekend ETOH intake   Electronic Signatures: Niara Bunker A (PA-C)  (Signed 25-Sep-14 09:24)  Authored: General Aspect/Present Illness, History and Physical Exam, Review of System, Home Medications, Labs, EKG , Allergies, Vital Signs/Nurse's Notes, Impression/Plan Ida Rogue (MD)  (Signed 25-Sep-14 09:33)  Authored: General Aspect/Present Illness, Review of System, Labs, EKG , Radiology, Impression/Plan  Co-Signer: General Aspect/Present Illness, Home Medications, EKG , Allergies   Last Updated: 25-Sep-14 09:33 by Ida Rogue (MD)

## 2014-07-02 ENCOUNTER — Telehealth: Payer: Self-pay | Admitting: *Deleted

## 2014-07-02 NOTE — Telephone Encounter (Signed)
Pt is calling asking during his last visit with Dr Rockey Situ, he was told that he would not need to be seen for a year.  He got a letter asking him to come back within 6 months. He just wants to be clear on this. Is is 81m or 69m? Please advise.

## 2014-08-12 ENCOUNTER — Other Ambulatory Visit: Payer: Self-pay | Admitting: Cardiovascular Disease

## 2014-08-17 ENCOUNTER — Other Ambulatory Visit: Payer: Self-pay | Admitting: Cardiovascular Disease

## 2014-08-19 ENCOUNTER — Ambulatory Visit (INDEPENDENT_AMBULATORY_CARE_PROVIDER_SITE_OTHER): Payer: 59 | Admitting: Cardiovascular Disease

## 2014-08-19 ENCOUNTER — Encounter: Payer: Self-pay | Admitting: Cardiovascular Disease

## 2014-08-19 VITALS — BP 110/80 | HR 83 | Ht 72.0 in | Wt 229.2 lb

## 2014-08-19 DIAGNOSIS — E118 Type 2 diabetes mellitus with unspecified complications: Secondary | ICD-10-CM

## 2014-08-19 DIAGNOSIS — I10 Essential (primary) hypertension: Secondary | ICD-10-CM

## 2014-08-19 DIAGNOSIS — G4730 Sleep apnea, unspecified: Secondary | ICD-10-CM

## 2014-08-19 DIAGNOSIS — I4891 Unspecified atrial fibrillation: Secondary | ICD-10-CM | POA: Diagnosis not present

## 2014-08-19 NOTE — Patient Instructions (Signed)
You are doing well. No medication changes were made.  Please call us if you have new issues that need to be addressed before your next appt.  Your physician wants you to follow-up in: 12 months.  You will receive a reminder letter in the mail two months in advance. If you don't receive a letter, please call our office to schedule the follow-up appointment. 

## 2014-08-19 NOTE — Assessment & Plan Note (Signed)
No symptoms concerning for atrial fibrillation. He will continue on anticoagulation, very low-dose metoprolol

## 2014-08-19 NOTE — Assessment & Plan Note (Signed)
Diabetes is well controlled. Suggested he continue with his exercise, weight loss program

## 2014-08-19 NOTE — Assessment & Plan Note (Signed)
Compliant with his CPAP, tolerating this well.

## 2014-08-19 NOTE — Progress Notes (Signed)
Patient ID: Brandon [name], male    DOB: 1956-02-21, 59 y.o.   MRN: [id number]  HPI Comments: Brandon [name] is a very pleasant 59 year old gentleman with a history of atrial fibrillation, presents for followup of his atrial fibrillation He has a history of sleep apnea, wears CPAP on a regular basis  In follow-up today, he reports that he has been doing well, denies having any palpitations concerning for atrial fibrillation. He takes a very low-dose metoprolol twice a day, one quarter of a pill Weight continues to slowly trending down. Total cholesterol 160s, LDL 100, hemoglobin A1c 5.8 Tolerating his medications. No significant bruising on his anticoagulation Reports that he is compliant with his CPAP, sleeps well  EKG on today's visit shows normal sinus rhythm  no significant ST or T-wave changes  Prior medical history  hospitalization 11/29/2012. History of alcohol, obesity, suspected sleep apnea, presenting with atrial fibrillation.  Magnesium was low on arrival. He was started on metoprolol tartrate, anticoagulation with eliquis. Also diagnosed with diabetes type 2 with hemoglobin A1c 10.8. He converted to normal sinus rhythm during his hospital course  Echocardiogram showed ejection fraction 60-65%, normal RV function, mild MR, moderately elevated right ventricular systolic pressures. Liver enzymes were mildly elevated. Glucose 236   Allergies  Allergen Reactions  . Penicillin G Anaphylaxis  . Penicillins     Outpatient Encounter Prescriptions as of 08/19/2014  Medication Sig  . Ascorbic Acid (VITAMIN C) 1000 MG tablet Take 1,000 mg by mouth daily.  Marland Kitchen CIALIS 5 MG tablet TAKE 1 TABLET BY MOUTH EVERY DAY AS NEEDED FOR ERECTILE DYSFUNCTION  . CINNAMON PO Take by mouth 3 (three) times daily.  Marland Kitchen ELIQUIS 5 MG TABS tablet TAKE 1 TABLET BY MOUTH TWICE DAILY  . enalapril (VASOTEC) 5 MG tablet TAKE 1 TABLET BY MOUTH EVERY DAY  . glucose blood (ACCU-CHEK ACTIVE STRIPS) test strip Use as  instructed  . metFORMIN (GLUCOPHAGE) 500 MG tablet Take 500 mg by mouth 2 (two) times daily with a meal.  . metoprolol tartrate (LOPRESSOR) 25 MG tablet TAKE 1 TABLET BY MOUTH TWICE DAILY  . Multiple Vitamins-Minerals (MEGA BASIC PO) Take by mouth.  . Omega-3 Fatty Acids (FISH OIL PO) Take by mouth daily.  . ranitidine (ZANTAC) 75 MG tablet Take 75 mg by mouth 2 (two) times daily.  . [DISCONTINUED] apixaban (ELIQUIS) 5 MG TABS tablet Take 1 tablet (5 mg total) by mouth 2 (two) times daily. (Patient not taking: Reported on 08/19/2014)   No facility-administered encounter medications on file as of 08/19/2014.    Past Medical History  Diagnosis Date  . GERD (gastroesophageal reflux disease)   . Type 2 diabetes mellitus   . A-fib   . Hypertension     Past Surgical History  Procedure Laterality Date  . Appendectomy    . Hernia repair      Social History  reports that he has quit smoking. He does not have any smokeless tobacco history on file. He reports that he drinks alcohol. He reports that he does not use illicit drugs.  Family History Family history is unknown by patient.  Review of Systems  Constitutional: Negative.   Respiratory: Negative.   Cardiovascular: Negative.   Gastrointestinal: Negative.   Musculoskeletal: Negative.   Neurological: Negative.   Hematological: Negative.   Psychiatric/Behavioral: Negative.   All other systems reviewed and are negative.   BP 110/80 mmHg  Pulse 83  Ht 6' (1.829 m)  Wt 229 lb 4 oz (103.987  kg)  BMI 31.09 kg/m2  Physical Exam  Constitutional: He is oriented to person, place, and time. He appears well-developed and well-nourished.  HENT:  Head: Normocephalic.  Nose: Nose normal.  Mouth/Throat: Oropharynx is clear and moist.  Eyes: Conjunctivae are normal. Pupils are equal, round, and reactive to light.  Neck: Normal range of motion. Neck supple. No JVD present.  Cardiovascular: Normal rate, regular rhythm, S1 normal, S2  normal, normal heart sounds and intact distal pulses.  Exam reveals no gallop and no friction rub.   No murmur heard. Pulmonary/Chest: Effort normal and breath sounds normal. No respiratory distress. He has no wheezes. He has no rales. He exhibits no tenderness.  Abdominal: Soft. Bowel sounds are normal. He exhibits no distension. There is no tenderness.  Musculoskeletal: Normal range of motion. He exhibits no edema or tenderness.  Lymphadenopathy:    He has no cervical adenopathy.  Neurological: He is alert and oriented to person, place, and time. Coordination normal.  Skin: Skin is warm and dry. No rash noted. No erythema.  Psychiatric: He has a normal mood and affect. His behavior is normal. Judgment and thought content normal.      Assessment and Plan   Nursing note and vitals reviewed.

## 2014-08-19 NOTE — Assessment & Plan Note (Signed)
Blood pressure is well controlled on today's visit. No changes made to the medications. 

## 2014-09-28 ENCOUNTER — Other Ambulatory Visit: Payer: Self-pay | Admitting: Cardiovascular Disease

## 2014-12-14 ENCOUNTER — Other Ambulatory Visit: Payer: Self-pay | Admitting: Cardiovascular Disease

## 2015-04-27 ENCOUNTER — Other Ambulatory Visit: Payer: Self-pay | Admitting: Cardiovascular Disease

## 2015-06-29 ENCOUNTER — Telehealth: Payer: Self-pay | Admitting: Cardiovascular Disease

## 2015-06-29 NOTE — Telephone Encounter (Signed)
Received cardiac clearance request for pt to proceed w/ elective colonoscopy on 08/17/15 w/ Dr. Gustavo Lah @ [facility]. They are requesting recommendations on holding Eliquis x 5 days prior to procedure.  Form placed in Dr. Donivan Scull folder for review.

## 2015-06-30 NOTE — Telephone Encounter (Signed)
Dr. Donivan Scull message routed to [facility] @ [phone].

## 2015-06-30 NOTE — Telephone Encounter (Signed)
Acceptable risk to proceed with surgery, Would only hold his eliquis for 3 days prior to the procedure (Does not need 5 days) Restart the anticoagulation 24 hours following the procedure if biopsies taken, otherwise start night after the procedure if no biopsies

## 2015-07-02 NOTE — Telephone Encounter (Signed)
Signed clearance form faxed to [facility] @ [phone].

## 2015-07-31 ENCOUNTER — Other Ambulatory Visit: Payer: Self-pay | Admitting: Cardiovascular Disease

## 2015-08-14 ENCOUNTER — Encounter: Payer: Self-pay | Admitting: *Deleted

## 2015-08-17 ENCOUNTER — Ambulatory Visit
Admission: RE | Admit: 2015-08-17 | Discharge: 2015-08-17 | Disposition: A | Discharge status: Home Health | Payer: 59 | Source: Ambulatory Visit | Attending: Gastroenterology | Admitting: Gastroenterology

## 2015-08-17 ENCOUNTER — Ambulatory Visit: Payer: 59 | Admitting: Anesthesiology

## 2015-08-17 ENCOUNTER — Encounter
Admission: RE | Disposition: A | Discharge status: Home Health | Payer: Self-pay | Source: Ambulatory Visit | Attending: Gastroenterology

## 2015-08-17 ENCOUNTER — Encounter: Payer: Self-pay | Admitting: *Deleted

## 2015-08-17 DIAGNOSIS — I4891 Unspecified atrial fibrillation: Secondary | ICD-10-CM | POA: Insufficient documentation

## 2015-08-17 DIAGNOSIS — I10 Essential (primary) hypertension: Secondary | ICD-10-CM | POA: Insufficient documentation

## 2015-08-17 DIAGNOSIS — Z85828 Personal history of other malignant neoplasm of skin: Secondary | ICD-10-CM | POA: Insufficient documentation

## 2015-08-17 DIAGNOSIS — G4730 Sleep apnea, unspecified: Secondary | ICD-10-CM | POA: Insufficient documentation

## 2015-08-17 DIAGNOSIS — Z79899 Other long term (current) drug therapy: Secondary | ICD-10-CM | POA: Insufficient documentation

## 2015-08-17 DIAGNOSIS — K219 Gastro-esophageal reflux disease without esophagitis: Secondary | ICD-10-CM | POA: Diagnosis not present

## 2015-08-17 DIAGNOSIS — Z7901 Long term (current) use of anticoagulants: Secondary | ICD-10-CM | POA: Diagnosis not present

## 2015-08-17 DIAGNOSIS — D124 Benign neoplasm of descending colon: Secondary | ICD-10-CM | POA: Insufficient documentation

## 2015-08-17 DIAGNOSIS — D123 Benign neoplasm of transverse colon: Secondary | ICD-10-CM | POA: Diagnosis not present

## 2015-08-17 DIAGNOSIS — E119 Type 2 diabetes mellitus without complications: Secondary | ICD-10-CM | POA: Diagnosis not present

## 2015-08-17 DIAGNOSIS — Z7984 Long term (current) use of oral hypoglycemic drugs: Secondary | ICD-10-CM | POA: Insufficient documentation

## 2015-08-17 DIAGNOSIS — K621 Rectal polyp: Secondary | ICD-10-CM | POA: Diagnosis not present

## 2015-08-17 DIAGNOSIS — Z1211 Encounter for screening for malignant neoplasm of colon: Secondary | ICD-10-CM | POA: Diagnosis not present

## 2015-08-17 DIAGNOSIS — Z880 Allergy status to penicillin: Secondary | ICD-10-CM | POA: Insufficient documentation

## 2015-08-17 HISTORY — DX: Malignant (primary) neoplasm, unspecified: C80.1

## 2015-08-17 HISTORY — DX: Sleep apnea, unspecified: G47.30

## 2015-08-17 HISTORY — PX: COLONOSCOPY WITH PROPOFOL: SHX5780

## 2015-08-17 LAB — GLUCOSE, CAPILLARY: GLUCOSE-CAPILLARY: 102 mg/dL — AB (ref 65–99)

## 2015-08-17 SURGERY — COLONOSCOPY WITH PROPOFOL
Anesthesia: General

## 2015-08-17 MED ORDER — PROPOFOL 10 MG/ML IV BOLUS
INTRAVENOUS | Status: DC | PRN
  Administered 2015-08-17: 100 mg via INTRAVENOUS

## 2015-08-17 MED ORDER — FENTANYL CITRATE (PF) 100 MCG/2ML IJ SOLN
INTRAMUSCULAR | Status: DC | PRN
  Administered 2015-08-17: 50 ug via INTRAVENOUS

## 2015-08-17 MED ORDER — SODIUM CHLORIDE 0.9 % IV SOLN: INTRAVENOUS | Status: DC

## 2015-08-17 MED ORDER — MIDAZOLAM HCL 2 MG/2ML IJ SOLN
INTRAMUSCULAR | Status: DC | PRN
  Administered 2015-08-17: 1 mg via INTRAVENOUS

## 2015-08-17 MED ORDER — SODIUM CHLORIDE 0.9 % IV SOLN
INTRAVENOUS | Status: DC
Start: 2015-08-17 — End: 2015-08-17
  Administered 2015-08-17: 11:00:00 via INTRAVENOUS
  Administered 2015-08-17: 1000 mL via INTRAVENOUS

## 2015-08-17 MED ORDER — PROPOFOL 500 MG/50ML IV EMUL
INTRAVENOUS | Status: DC | PRN
  Administered 2015-08-17: 150 ug/kg/min via INTRAVENOUS

## 2015-08-17 NOTE — H&P (Signed)
Outpatient short stay form Pre-procedure 08/17/2015 11:28 AM Lollie Sails MD  Primary Physician: Dr. Thereasa Distance  Reason for visit:  Colonoscopy  History of present illness:  Patient is a 60 year old male presenting today for screening colonoscopy. This is first procedure. He does take a blood thinner eliquis which she has held for the past 4 days. He occasionally take some BC powder but not recently. He denies use of any other aspirin products or blood thinning agents.    Current facility-administered medications:  .  0.9 %  sodium chloride infusion, , Intravenous, Continuous, Lollie Sails, MD, Last Rate: 20 mL/hr at 08/17/15 1002, 1,000 mL at 08/17/15 1002 .  0.9 %  sodium chloride infusion, , Intravenous, Continuous, Lollie Sails, MD  Prescriptions prior to admission  Medication Sig Dispense Refill Last Dose  . Ascorbic Acid (VITAMIN C) 1000 MG tablet Take 1,000 mg by mouth daily.   Taking  . CIALIS 5 MG tablet TAKE 1 TABLET BY MOUTH EVERY DAY AS NEEDED FOR ERECTILE DYSFUNCTION 30 tablet 0 Taking  . CINNAMON PO Take by mouth 3 (three) times daily.   Taking  . ELIQUIS 5 MG TABS tablet TAKE 1 TABLET BY MOUTH TWICE DAILY 60 tablet 3   . enalapril (VASOTEC) 5 MG tablet TAKE 1 TABLET BY MOUTH EVERY DAY 30 tablet 6   . glucose blood (ACCU-CHEK ACTIVE STRIPS) test strip Use as instructed 100 each 12 Taking  . metFORMIN (GLUCOPHAGE) 500 MG tablet Take 500 mg by mouth 2 (two) times daily with a meal.   Taking  . metoprolol tartrate (LOPRESSOR) 25 MG tablet TAKE 1 TABLET BY MOUTH TWICE DAILY 180 tablet 3   . Multiple Vitamins-Minerals (MEGA BASIC PO) Take by mouth.   Taking  . Omega-3 Fatty Acids (FISH OIL PO) Take by mouth daily.   Taking  . ranitidine (ZANTAC) 75 MG tablet Take 75 mg by mouth 2 (two) times daily.   Taking     Allergies  Allergen Reactions  . Penicillin G Anaphylaxis  . Penicillins      Past Medical History  Diagnosis Date  . GERD  (gastroesophageal reflux disease)   . Type 2 diabetes mellitus ([place])   . A-fib ([place])   . Hypertension   . Sleep apnea   . Cancer [facility])     skin    Review of systems:      Physical Exam    Heart and lungs: Regular rate and rhythm without rub or gallop, lungs are bilaterally clear.    HEENT: Normocephalic atraumatic eyes are anicteric    Other:     Pertinant exam for procedure: Soft nontender nondistended bowel sounds positive normoactive.    Planned proceedures: Colonoscopy and indicated procedures.    Lollie Sails, MD Gastroenterology 08/17/2015  11:28 AM

## 2015-08-17 NOTE — Op Note (Signed)
[facility] Gastroenterology Patient Name: Brandon [name] Procedure Date: 08/17/2015 11:28 AM MRN: [id number] Account #: [id number] Date of Birth: 1955/12/09 Admit Type: Outpatient Age: 60 Room: [facility] ENDO ROOM 3 Gender: Male Note Status: Finalized Procedure:            Colonoscopy Indications:          Screening for colorectal malignant neoplasm, This is                        the patient's first colonoscopy Providers:            Lollie Sails, MD Referring MD:         Sofie Hartigan (Referring MD) Medicines:            Monitored Anesthesia Care Complications:        No immediate complications. Procedure:            Pre-Anesthesia Assessment:                       - ASA Grade Assessment: III - A patient with severe                        systemic disease.                       After obtaining informed consent, the colonoscope was                        passed under direct vision. Throughout the procedure,                        the patient's blood pressure, pulse, and oxygen                        saturations were monitored continuously. The                        Colonoscope was introduced through the anus and                        advanced to the the cecum, identified by appendiceal                        orifice and ileocecal valve. The quality of the bowel                        preparation was fair except the ascending colon was                        poor. Findings:      A 4 mm polyp was found in the descending colon. The polyp was sessile.       The polyp was removed with a cold snare. Resection and retrieval were       complete.      A 2 mm polyp was found in the transverse colon. The polyp was sessile.       The polyp was removed with a cold biopsy forceps. Resection and       retrieval were complete.      A 2 mm polyp was found in the descending colon. The polyp was sessile.  The polyp was removed with a cold biopsy forceps. Resection and       retrieval were complete.      A 2 mm polyp was found in the rectum. The polyp was sessile. The polyp       was removed with a cold biopsy forceps. Resection and retrieval were       complete.      The digital rectal exam was normal.      The retroflexed view of the distal rectum and anal verge was normal and       showed no anal or rectal abnormalities. Impression:           - One 4 mm polyp in the descending colon, removed with                        a cold snare. Resected and retrieved.                       - One 2 mm polyp in the transverse colon, removed with                        a cold biopsy forceps. Resected and retrieved.                       - One 2 mm polyp in the descending colon, removed with                        a cold biopsy forceps. Resected and retrieved.                       - One 2 mm polyp in the rectum, removed with a cold                        biopsy forceps. Resected and retrieved.                       - The distal rectum and anal verge are normal on                        retroflexion view. Recommendation:       - Discharge patient to home. Procedure Code(s):    --- Professional ---                       [phone], Colonoscopy, flexible; with removal of tumor(s),                        polyp(s), or other lesion(s) by snare technique                       [id number], 90, Colonoscopy, flexible; with biopsy, single                        or multiple Diagnosis Code(s):    --- Professional ---                       Z12.11, Encounter for screening for malignant neoplasm                        of colon  D12.3, Benign neoplasm of transverse colon (hepatic                        flexure or splenic flexure)                       D12.4, Benign neoplasm of descending colon                       K62.1, Rectal polyp CPT copyright 2016 American Medical Association. All rights reserved. The codes documented in this report are preliminary and upon coder  review may  be revised to meet current compliance requirements. Lollie Sails, MD 08/17/2015 12:11:29 PM This report has been signed electronically. Number of Addenda: 0 Note Initiated On: 08/17/2015 11:28 AM Scope Withdrawal Time: 0 hours 11 minutes 1 second  Total Procedure Duration: 0 hours 24 minutes 5 seconds       [facility]

## 2015-08-17 NOTE — Anesthesia Procedure Notes (Signed)
Date/Time: 08/17/2015 11:45 AM Performed by: Nelda Marseille Pre-anesthesia Checklist: Patient identified, Emergency Drugs available, Suction available, Patient being monitored and Timeout performed Oxygen Delivery Method: Nasal cannula

## 2015-08-17 NOTE — Anesthesia Preprocedure Evaluation (Addendum)
Anesthesia Evaluation  Patient identified by MRN, date of birth, ID band Patient awake    Reviewed: Allergy & Precautions, NPO status , Patient's Chart, lab work & pertinent test results, reviewed documented beta blocker date and time   Airway Mallampati: II  TM Distance: >3 FB     Dental  (+) Chipped   Pulmonary sleep apnea ,    Pulmonary exam normal        Cardiovascular hypertension, Pt. on medications and Pt. on home beta blockers Normal cardiovascular exam     Neuro/Psych negative neurological ROS  negative psych ROS   GI/Hepatic Neg liver ROS, GERD  Medicated and Controlled,  Endo/Other  diabetes, Well Controlled, Type 2, Oral Hypoglycemic Agents  Renal/GU negative Renal ROS  negative genitourinary   Musculoskeletal negative musculoskeletal ROS (+)   Abdominal Normal abdominal exam  (+)   Peds negative pediatric ROS (+)  Hematology negative hematology ROS (+)   Anesthesia Other Findings   Reproductive/Obstetrics                            Anesthesia Physical Anesthesia Plan  ASA: III  Anesthesia Plan: General   Post-op Pain Management:    Induction: Intravenous  Airway Management Planned: Nasal Cannula  Additional Equipment:   Intra-op Plan:   Post-operative Plan:   Informed Consent: I have reviewed the patients History and Physical, chart, labs and discussed the procedure including the risks, benefits and alternatives for the proposed anesthesia with the patient or authorized representative who has indicated his/her understanding and acceptance.   Dental advisory given  Plan Discussed with: CRNA and Surgeon  Anesthesia Plan Comments:         Anesthesia Quick Evaluation

## 2015-08-17 NOTE — Transfer of Care (Signed)
Immediate Anesthesia Transfer of Care Note  Patient: Brandon [name]  Procedure(s) Performed: Procedure(s): COLONOSCOPY WITH PROPOFOL (N/A)  Patient Location: PACU  Anesthesia Type:General  Level of Consciousness: sedated  Airway & Oxygen Therapy: Patient Spontanous Breathing and Patient connected to nasal cannula oxygen  Post-op Assessment: Report given to RN and Post -op Vital signs reviewed and stable  Post vital signs: Reviewed and stable  Last Vitals:  Filed Vitals:   08/17/15 0934  BP: 130/87  Pulse: 71  Temp: 35.9 C  Resp: 20    Last Pain: There were no vitals filed for this visit.       Complications: No apparent anesthesia complications

## 2015-08-18 LAB — SURGICAL PATHOLOGY

## 2015-08-19 ENCOUNTER — Encounter: Payer: Self-pay | Admitting: Gastroenterology

## 2015-08-19 NOTE — Anesthesia Postprocedure Evaluation (Signed)
Anesthesia Post Note  Patient: Brandon [name]  Procedure(s) Performed: Procedure(s) (LRB): COLONOSCOPY WITH PROPOFOL (N/A)  Patient location during evaluation: PACU Anesthesia Type: General Level of consciousness: awake and alert and oriented Pain management: pain level controlled Vital Signs Assessment: post-procedure vital signs reviewed and stable Respiratory status: spontaneous breathing Cardiovascular status: blood pressure returned to baseline Anesthetic complications: no    Last Vitals:  Filed Vitals:   08/17/15 1234 08/17/15 1243  BP: 120/85 120/82  Pulse: 63 63  Temp:    Resp: 18 15    Last Pain:  Filed Vitals:   08/18/15 0753  PainSc: 0-No pain                 Corrado Hymon

## 2015-08-26 ENCOUNTER — Other Ambulatory Visit: Payer: Self-pay | Admitting: Cardiovascular Disease

## 2015-08-28 ENCOUNTER — Encounter: Payer: Self-pay | Admitting: Cardiovascular Disease

## 2015-08-28 ENCOUNTER — Ambulatory Visit (INDEPENDENT_AMBULATORY_CARE_PROVIDER_SITE_OTHER): Payer: 59 | Admitting: Cardiovascular Disease

## 2015-08-28 VITALS — BP 130/78 | HR 67 | Ht 72.0 in | Wt 233.8 lb

## 2015-08-28 DIAGNOSIS — I10 Essential (primary) hypertension: Secondary | ICD-10-CM | POA: Diagnosis not present

## 2015-08-28 DIAGNOSIS — I4891 Unspecified atrial fibrillation: Secondary | ICD-10-CM | POA: Diagnosis not present

## 2015-08-28 DIAGNOSIS — G4730 Sleep apnea, unspecified: Secondary | ICD-10-CM | POA: Diagnosis not present

## 2015-08-28 DIAGNOSIS — E118 Type 2 diabetes mellitus with unspecified complications: Secondary | ICD-10-CM

## 2015-08-28 DIAGNOSIS — E785 Hyperlipidemia, unspecified: Secondary | ICD-10-CM | POA: Insufficient documentation

## 2015-08-28 MED ORDER — APIXABAN 5 MG PO TABS: 5.0000 mg | ORAL_TABLET | Freq: Two times a day (BID) | ORAL | Status: DC

## 2015-08-28 MED ORDER — ROSUVASTATIN CALCIUM 5 MG PO TABS: 5.0000 mg | ORAL_TABLET | Freq: Every day | ORAL | Status: DC

## 2015-08-28 MED ORDER — ENALAPRIL MALEATE 5 MG PO TABS: 5.0000 mg | ORAL_TABLET | Freq: Every day | ORAL | Status: DC

## 2015-08-28 MED ORDER — NEBIVOLOL HCL 5 MG PO TABS: 5.0000 mg | ORAL_TABLET | Freq: Every day | ORAL | Status: DC

## 2015-08-28 NOTE — Patient Instructions (Addendum)
Medication Instructions:  Please start crestor 5 mg daily  Please start bystolic 5 mg daily Stop the the metoprolol   LABS END OF SEPT with PMD  Testing/Procedures:  CT coronary calcium score $150  Follow-Up: It was a pleasure seeing you in the office today. Please call us if you have new issues that need to be addressed before your next appt.  [phone]  Your physician wants you to follow-up in: 12 months.  You will receive a reminder letter in the mail two months in advance. If you don't receive a letter, please call our office to schedule the follow-up appointment.  If you need a refill on your cardiac medications before your next appointment, please call your pharmacy.

## 2015-08-28 NOTE — Progress Notes (Signed)
Patient ID: Brandon [name], male   DOB: 1955-09-30, 60 y.o.   MRN: [id number] Cardiology Office Note  Date:  08/28/2015   ID:  Brandon [name], DOB 1956/02/19, MRN [id number]  PCP:  Brandon Hartigan, MD   Chief Complaint  Patient presents with  . other    1 yr f/u. Meds reviewed verbally with pt.    HPI:  [name] is a very pleasant 60 year old gentleman with a history of atrial fibrillation, presents for followup of his atrial fibrillation He has a history of sleep apnea, wears CPAP on a regular basis  In follow-up, he denies having any palpitations concerning for atrial fibrillation. Currently feels some fatigue on low-dose metoprolol one half pill daily, would like to try alternate medication Also talked with family, feels LDL is high given he has history of diabetes Review of lab work shows hemoglobin A1c in the 6 range Denies any chest pain concerning for angina compliant with his CPAP, sleeps well  EKG on today's visit shows normal sinus rhythmwith rate 67 bpm, no significant ST or T-wave changes  Prior medical history hospitalization 11/29/2012. History of alcohol, obesity, suspected sleep apnea, presenting with atrial fibrillation. Magnesium was low on arrival. He was started on metoprolol tartrate, anticoagulation with eliquis. Also diagnosed with diabetes type 2 with hemoglobin A1c 10.8. He converted to normal sinus rhythm during his hospital course  Echocardiogram showed ejection fraction 60-65%, normal RV function, mild MR, moderately elevated right ventricular systolic pressures. Liver enzymes were mildly elevated. Glucose 236  PMH:   has a past medical history of GERD (gastroesophageal reflux disease); Type 2 diabetes mellitus ([place]); A-fib ([place]); Hypertension; Sleep apnea; and Cancer ([place]).  PSH:    Past Surgical History  Procedure Laterality Date  . Appendectomy    . Hernia repair    . Colonoscopy with propofol N/A 08/17/2015    Procedure: COLONOSCOPY WITH  PROPOFOL;  Surgeon: Lollie Sails, MD;  Location: [facility] ENDOSCOPY;  Service: Endoscopy;  Laterality: N/A;    Current Outpatient Prescriptions  Medication Sig Dispense Refill  . apixaban (ELIQUIS) 5 MG TABS tablet Take 1 tablet (5 mg total) by mouth 2 (two) times daily. 180 tablet 3  . Ascorbic Acid (VITAMIN C) 1000 MG tablet Take 1,000 mg by mouth daily.    Marland Kitchen CIALIS 5 MG tablet TAKE 1 TABLET BY MOUTH EVERY DAY AS NEEDED FOR ERECTILE DYSFUNCTION 30 tablet 0  . CINNAMON PO Take by mouth 3 (three) times daily.    . enalapril (VASOTEC) 5 MG tablet Take 1 tablet (5 mg total) by mouth daily. 90 tablet 3  . glucose blood (ACCU-CHEK ACTIVE STRIPS) test strip Use as instructed 100 each 12  . metFORMIN (GLUCOPHAGE) 500 MG tablet Take 500 mg by mouth 2 (two) times daily with a meal.    . Multiple Vitamins-Minerals (MEGA BASIC PO) Take by mouth.    . Omega-3 Fatty Acids (FISH OIL PO) Take by mouth daily.    . ranitidine (ZANTAC) 75 MG tablet Take 75 mg by mouth 2 (two) times daily.    . nebivolol (BYSTOLIC) 5 MG tablet Take 1 tablet (5 mg total) by mouth daily. 90 tablet 3  . rosuvastatin (CRESTOR) 5 MG tablet Take 1 tablet (5 mg total) by mouth daily. 90 tablet 3   No current facility-administered medications for this visit.     Allergies:   Penicillin g and Penicillins   Social History:  The patient  reports that he has never smoked.  He has never used smokeless tobacco. He reports that he drinks alcohol. He reports that he does not use illicit drugs.   Family History:   Family history is unknown by patient.    Review of Systems: Review of Systems  Constitutional: Negative.   Respiratory: Negative.   Cardiovascular: Negative.   Gastrointestinal: Negative.   Musculoskeletal: Negative.   Neurological: Negative.   Psychiatric/Behavioral: Negative.   All other systems reviewed and are negative.    PHYSICAL EXAM: VS:  BP 130/78 mmHg  Pulse 67  Ht 6' (1.829 m)  Wt 233 lb 12 oz  (106.028 kg)  BMI 31.70 kg/m2 , BMI Body mass index is 31.7 kg/(m^2). GEN: Well nourished, well developed, in no acute distress,obese HEENT: normal Neck: no JVD, carotid bruits, or masses Cardiac: RRR; no murmurs, rubs, or gallops,no edema  Respiratory:  clear to auscultation bilaterally, normal work of breathing GI: soft, nontender, nondistended, + BS MS: no deformity or atrophy Skin: warm and dry, no rash Neuro:  Strength and sensation are intact Psych: euthymic mood, full affect    Recent Labs: No results found for requested labs within last 365 days.    Lipid Panel Lab Results  Component Value Date   CHOL 150 11/29/2012   HDL 35* 11/29/2012   LDLCALC 87 11/29/2012   TRIG 141 11/29/2012      Wt Readings from Last 3 Encounters:  08/28/15 233 lb 12 oz (106.028 kg)  08/17/15 230 lb (104.327 kg)  08/19/14 229 lb 4 oz (103.987 kg)       ASSESSMENT AND PLAN:  Atrial fibrillation, unspecified type ([place]) - Plan: EKG 12-Lead Maintaining normal sinus rhythm, He would like to change beta blockers. Recommended he try bystolic 5 mg daily  Benign essential HTN Medication changes as above to minimize fatigue side effects Change made at patient request  Type 2 diabetes mellitus with complication, without long-term current use of insulin ([place]) We have encouraged continued exercise, careful diet management in an effort to lose weight.  Sleep apnea Compliant with his CPAP  Hyperlipidemia Long discussion concerning guidelines for cholesterol  As he is a diabetic, LDL should be less than 100. Recommended he start Crestor 5 mg daily  We did discuss screening tests for coronary disease. He will research CT coronary calcium score   Total encounter time more than 25 minutes  Greater than 50% was spent in counseling and coordination of care with the patient   Disposition:   F/U  12 months   Orders Placed This Encounter  Procedures  . EKG 12-Lead     Signed, Esmond Plants, M.D., Ph.D. 08/28/2015  [place], [place]

## 2015-12-08 ENCOUNTER — Ambulatory Visit (INDEPENDENT_AMBULATORY_CARE_PROVIDER_SITE_OTHER): Payer: 59 | Admitting: Family Medicine

## 2015-12-08 ENCOUNTER — Encounter (INDEPENDENT_AMBULATORY_CARE_PROVIDER_SITE_OTHER): Payer: Self-pay

## 2015-12-08 ENCOUNTER — Encounter: Payer: Self-pay | Admitting: Family Medicine

## 2015-12-08 VITALS — BP 132/88 | HR 70 | Temp 99.0°F | Ht 72.0 in | Wt 244.5 lb

## 2015-12-08 DIAGNOSIS — E119 Type 2 diabetes mellitus without complications: Secondary | ICD-10-CM

## 2015-12-08 DIAGNOSIS — I10 Essential (primary) hypertension: Secondary | ICD-10-CM

## 2015-12-08 DIAGNOSIS — I4891 Unspecified atrial fibrillation: Secondary | ICD-10-CM

## 2015-12-08 DIAGNOSIS — E785 Hyperlipidemia, unspecified: Secondary | ICD-10-CM | POA: Diagnosis not present

## 2015-12-08 DIAGNOSIS — Z23 Encounter for immunization: Secondary | ICD-10-CM

## 2015-12-08 DIAGNOSIS — Z125 Encounter for screening for malignant neoplasm of prostate: Secondary | ICD-10-CM | POA: Diagnosis not present

## 2015-12-08 DIAGNOSIS — G4733 Obstructive sleep apnea (adult) (pediatric): Secondary | ICD-10-CM

## 2015-12-08 MED ORDER — METFORMIN HCL 500 MG PO TABS: 500.0000 mg | ORAL_TABLET | Freq: Two times a day (BID) | ORAL | 1 refills | Status: DC

## 2015-12-08 NOTE — Patient Instructions (Signed)
We will call with your lab results.  Follow up in 6 months (sooner if needed).  Take care  Dr. Lacinda Axon   Health Maintenance, Male A healthy lifestyle and preventative care can promote health and wellness.  Maintain regular health, dental, and eye exams.  Eat a healthy diet. Foods like vegetables, fruits, whole grains, low-fat dairy products, and lean protein foods contain the nutrients you need and are low in calories. Decrease your intake of foods high in solid fats, added sugars, and salt. Get information about a proper diet from your health care provider, if necessary.  Regular physical exercise is one of the most important things you can do for your health. Most adults should get at least 150 minutes of moderate-intensity exercise (any activity that increases your heart rate and causes you to sweat) each week. In addition, most adults need muscle-strengthening exercises on 2 or more days a week.   Maintain a healthy weight. The body mass index (BMI) is a screening tool to identify possible weight problems. It provides an estimate of body fat based on height and weight. Your health care provider can find your BMI and can help you achieve or maintain a healthy weight. For males 20 years and older:  A BMI below 18.5 is considered underweight.  A BMI of 18.5 to 24.9 is normal.  A BMI of 25 to 29.9 is considered overweight.  A BMI of 30 and above is considered obese.  Maintain normal blood lipids and cholesterol by exercising and minimizing your intake of saturated fat. Eat a balanced diet with plenty of fruits and vegetables. Blood tests for lipids and cholesterol should begin at age 3 and be repeated every 5 years. If your lipid or cholesterol levels are high, you are over age 64, or you are at high risk for heart disease, you may need your cholesterol levels checked more frequently.Ongoing high lipid and cholesterol levels should be treated with medicines if diet and exercise are not  working.  If you smoke, find out from your health care provider how to quit. If you do not use tobacco, do not start.  Lung cancer screening is recommended for adults aged 15-80 years who are at high risk for developing lung cancer because of a history of smoking. A yearly low-dose CT scan of the lungs is recommended for people who have at least a 30-pack-year history of smoking and are current smokers or have quit within the past 15 years. A pack year of smoking is smoking an average of 1 pack of cigarettes a day for 1 year (for example, a 30-pack-year history of smoking could mean smoking 1 pack a day for 30 years or 2 packs a day for 15 years). Yearly screening should continue until the smoker has stopped smoking for at least 15 years. Yearly screening should be stopped for people who develop a health problem that would prevent them from having lung cancer treatment.  If you choose to drink alcohol, do not have more than 2 drinks per day. One drink is considered to be 12 oz (360 mL) of beer, 5 oz (150 mL) of wine, or 1.5 oz (45 mL) of liquor.  Avoid the use of street drugs. Do not share needles with anyone. Ask for help if you need support or instructions about stopping the use of drugs.  High blood pressure causes heart disease and increases the risk of stroke. High blood pressure is more likely to develop in:  People who have blood pressure  in the end of the normal range (100-139/85-89 mm Hg).  People who are overweight or obese.  People who are African American.  If you are 88-38 years of age, have your blood pressure checked every 3-5 years. If you are 68 years of age or older, have your blood pressure checked every year. You should have your blood pressure measured twice--once when you are at a hospital or clinic, and once when you are not at a hospital or clinic. Record the average of the two measurements. To check your blood pressure when you are not at a hospital or clinic, you can  use:  An automated blood pressure machine at a pharmacy.  A home blood pressure monitor.  If you are 56-3 years old, ask your health care provider if you should take aspirin to prevent heart disease.  Diabetes screening involves taking a blood sample to check your fasting blood sugar level. This should be done once every 3 years after age 50 if you are at a normal weight and without risk factors for diabetes. Testing should be considered at a younger age or be carried out more frequently if you are overweight and have at least 1 risk factor for diabetes.  Colorectal cancer can be detected and often prevented. Most routine colorectal cancer screening begins at the age of 70 and continues through age 33. However, your health care provider may recommend screening at an earlier age if you have risk factors for colon cancer. On a yearly basis, your health care provider may provide home test kits to check for hidden blood in the stool. A small camera at the end of a tube may be used to directly examine the colon (sigmoidoscopy or colonoscopy) to detect the earliest forms of colorectal cancer. Talk to your health care provider about this at age 9 when routine screening begins. A direct exam of the colon should be repeated every 5-10 years through age 32, unless early forms of precancerous polyps or small growths are found.  People who are at an increased risk for hepatitis B should be screened for this virus. You are considered at high risk for hepatitis B if:  You were born in a country where hepatitis B occurs often. Talk with your health care provider about which countries are considered high risk.  Your parents were born in a high-risk country and you have not received a shot to protect against hepatitis B (hepatitis B vaccine).  You have HIV or AIDS.  You use needles to inject street drugs.  You live with, or have sex with, someone who has hepatitis B.  You are a man who has sex with other  men (MSM).  You get hemodialysis treatment.  You take certain medicines for conditions like cancer, organ transplantation, and autoimmune conditions.  Hepatitis C blood testing is recommended for all people born from [birth year] through 1963 and any individual with known risk factors for hepatitis C.  Healthy men should no longer receive prostate-specific antigen (PSA) blood tests as part of routine cancer screening. Talk to your health care provider about prostate cancer screening.  Testicular cancer screening is not recommended for adolescents or adult males who have no symptoms. Screening includes self-exam, a health care provider exam, and other screening tests. Consult with your health care provider about any symptoms you have or any concerns you have about testicular cancer.  Practice safe sex. Use condoms and avoid high-risk sexual practices to reduce the spread of sexually transmitted infections (STIs).  You should be screened for STIs, including gonorrhea and chlamydia if:  You are sexually active and are younger than 24 years.  You are older than 24 years, and your health care provider tells you that you are at risk for this type of infection.  Your sexual activity has changed since you were last screened, and you are at an increased risk for chlamydia or gonorrhea. Ask your health care provider if you are at risk.  If you are at risk of being infected with HIV, it is recommended that you take a prescription medicine daily to prevent HIV infection. This is called pre-exposure prophylaxis (PrEP). You are considered at risk if:  You are a man who has sex with other men (MSM).  You are a heterosexual man who is sexually active with multiple partners.  You take drugs by injection.  You are sexually active with a partner who has HIV.  Talk with your health care provider about whether you are at high risk of being infected with HIV. If you choose to begin PrEP, you should first be tested  for HIV. You should then be tested every 3 months for as long as you are taking PrEP.  Use sunscreen. Apply sunscreen liberally and repeatedly throughout the day. You should seek shade when your shadow is shorter than you. Protect yourself by wearing long sleeves, pants, a wide-brimmed hat, and sunglasses year round whenever you are outdoors.  Tell your health care provider of new moles or changes in moles, especially if there is a change in shape or color. Also, tell your health care provider if a mole is larger than the size of a pencil eraser.  A one-time screening for abdominal aortic aneurysm (AAA) and surgical repair of large AAAs by ultrasound is recommended for men aged 26-75 years who are current or former smokers.  Stay current with your vaccines (immunizations).   This information is not intended to replace advice given to you by your health care provider. Make sure you discuss any questions you have with your health care provider.   Document Released: 08/20/2007 Document Revised: 03/14/2014 Document Reviewed: 07/19/2010 Elsevier Interactive Patient Education [employer].

## 2015-12-08 NOTE — Progress Notes (Signed)
Pre visit review using our clinic review tool, if applicable. No additional management support is needed unless otherwise documented below in the visit note. 

## 2015-12-09 ENCOUNTER — Encounter: Payer: Self-pay | Admitting: Family Medicine

## 2015-12-09 LAB — LIPID PANEL
CHOL/HDL RATIO: 3
Cholesterol: 134 mg/dL (ref 0–200)
HDL: 51 mg/dL (ref >39.00)
LDL Cholesterol: 61 mg/dL (ref 0–99)
NonHDL: 83.18
TRIGLYCERIDES: 109 mg/dL (ref 0.0–149.0)
VLDL: 21.8 mg/dL (ref 0.0–40.0)

## 2015-12-09 LAB — PSA: PSA: 4.19 ng/mL — ABNORMAL HIGH (ref 0.10–4.00)

## 2015-12-09 LAB — HEMOGLOBIN A1C: HEMOGLOBIN A1C: 6.5 % (ref 4.6–6.5)

## 2015-12-09 NOTE — Assessment & Plan Note (Signed)
Stable. Patient in sinus rhythm today. Continue Bystolic and Eliquis.

## 2015-12-09 NOTE — Progress Notes (Signed)
Subjective:  Patient ID: Brandon [name], male    DOB: 1955-12-23  Age: 60 y.o. MRN: [id number]  CC: Establish care  HPI Brandon [name] is a 60 y.o. male presents to the clinic today to establish care. Issues/problems are discussed below.  HTN  Stable on Enalapril and Bystolic.   No reported side effects.   HLD  Has been uncontrolled/not at goal.  He has been placed on Crestor as of June 2017.  Needs lipid panel today.  DM-2  Blood sugars well controlled. Fasting in the 120s to 130s.  He states that he's compliant with metformin 500 mg twice daily.  No reported side effects.  In need of A1C today.  OSA  Compliant with CPAP.  A fib  Stable on Bystolic and Eliquis.  PMH, Surgical Hx, Family Hx, Social History reviewed and updated as below.  Past Medical History:  Diagnosis Date  . A-fib ([place])   . Cancer ([place])    skin  . Chicken pox   . GERD (gastroesophageal reflux disease)   . Hyperlipidemia   . Hypertension   . Sleep apnea   . Type 2 diabetes mellitus ([place])    Past Surgical History:  Procedure Laterality Date  . APPENDECTOMY    . COLONOSCOPY WITH PROPOFOL N/A 08/17/2015   Procedure: COLONOSCOPY WITH PROPOFOL;  Surgeon: Lollie Sails, MD;  Location[facility] ENDOSCOPY;  Service: Endoscopy;  Laterality: N/A;  . HERNIA REPAIR     Family History  Problem Relation Age of Onset  . Diabetes Mother   . Alcohol abuse Father   . Prostate cancer Father   . Diabetes Maternal Grandmother    Social History  Substance Use Topics  . Smoking status: Never Smoker  . Smokeless tobacco: Never Used  . Alcohol use 3.0 oz/week    5 Cans of beer per week     Comment: social drinker   Review of Systems  Cardiovascular: Positive for palpitations.  Genitourinary:       Sexual difficulty. Lump in testicle (reports known varicocele).  Psychiatric/Behavioral:       Stress.  All other systems reviewed and are negative.  Objective:   Today's Vitals: BP 132/88 (BP  Location: Right Arm, Patient Position: Sitting, Cuff Size: Normal)   Pulse 70   Temp 99 F (37.2 C) (Oral)   Ht 6' (1.829 m)   Wt 244 lb 8 oz (110.9 kg)   SpO2 94%   BMI 33.16 kg/m   Physical Exam  Constitutional: He is oriented to person, place, and time. He appears well-developed and well-nourished. No distress.  HENT:  Head: Normocephalic and atraumatic.  Nose: Nose normal.  Mouth/Throat: Oropharynx is clear and moist. No oropharyngeal exudate.  Normal TM's bilaterally.   Eyes: Conjunctivae are normal. No scleral icterus.  Neck: Neck supple. No thyromegaly present.  Cardiovascular: Normal rate and regular rhythm.   No murmur heard. Pulmonary/Chest: Effort normal and breath sounds normal. He has no wheezes. He has no rales.  Abdominal: Soft. He exhibits no distension. There is no tenderness. There is no rebound and no guarding.  Musculoskeletal: Normal range of motion. He exhibits no edema.  Lymphadenopathy:    He has no cervical adenopathy.  Neurological: He is alert and oriented to person, place, and time.  Skin: Skin is warm and dry. No rash noted.  Psychiatric: He has a normal mood and affect.  Vitals reviewed.  Assessment & Plan:   Problem List Items Addressed This Visit  A-fib (HCC)    Stable. Patient in sinus rhythm today. Continue Bystolic and Eliquis.      Sleep apnea    Stable. Compliant with CPAP.      DM (diabetes mellitus), type 2 ([place])    Well controlled. A1c today. Continue metformin 500 twice a day.      Relevant Medications   metFORMIN (GLUCOPHAGE) 500 MG tablet   Other Relevant Orders   HgB A1c   Benign essential HTN    Well controlled. Continue enalapril and bystolic.       Hyperlipidemia    Unsure of control since being on Crestor. Lipid panel today. Will continue Crestor.      Relevant Orders   Lipid Profile    Other Visit Diagnoses    Need for prophylactic vaccination with combined diphtheria-tetanus-pertussis (DTP) vaccine     -  Primary   Relevant Orders   Tdap vaccine greater than or equal to 7yo IM (Completed)   Screening for prostate cancer       Relevant Orders   PSA      Outpatient Encounter Prescriptions as of 12/08/2015  Medication Sig  . apixaban (ELIQUIS) 5 MG TABS tablet Take 1 tablet (5 mg total) by mouth 2 (two) times daily.  . Ascorbic Acid (VITAMIN C) 1000 MG tablet Take 1,000 mg by mouth daily.  Marland Kitchen CIALIS 5 MG tablet TAKE 1 TABLET BY MOUTH EVERY DAY AS NEEDED FOR ERECTILE DYSFUNCTION  . CINNAMON PO Take by mouth 3 (three) times daily.  . enalapril (VASOTEC) 5 MG tablet Take 1 tablet (5 mg total) by mouth daily.  Marland Kitchen glucose blood (ACCU-CHEK ACTIVE STRIPS) test strip Use as instructed  . metFORMIN (GLUCOPHAGE) 500 MG tablet Take 1 tablet (500 mg total) by mouth 2 (two) times daily with a meal.  . Multiple Vitamins-Minerals (MEGA BASIC PO) Take by mouth.  . nebivolol (BYSTOLIC) 5 MG tablet Take 1 tablet (5 mg total) by mouth daily.  . Omega-3 Fatty Acids (FISH OIL PO) Take by mouth daily.  . ranitidine (ZANTAC) 75 MG tablet Take 75 mg by mouth 2 (two) times daily.  . rosuvastatin (CRESTOR) 5 MG tablet Take 1 tablet (5 mg total) by mouth daily.  . [DISCONTINUED] metFORMIN (GLUCOPHAGE) 500 MG tablet Take 500 mg by mouth 2 (two) times daily with a meal.   No facility-administered encounter medications on file as of 12/08/2015.     Follow-up: 6 months.   [place]

## 2015-12-09 NOTE — Assessment & Plan Note (Signed)
Well controlled. A1c today. Continue metformin 500 twice a day.

## 2015-12-09 NOTE — Assessment & Plan Note (Signed)
Well controlled. Continue enalapril and bystolic.

## 2015-12-09 NOTE — Assessment & Plan Note (Signed)
Unsure of control since being on Crestor. Lipid panel today. Will continue Crestor.

## 2015-12-09 NOTE — Assessment & Plan Note (Signed)
Stable. Compliant with CPAP

## 2016-05-13 DIAGNOSIS — G2581 Restless legs syndrome: Secondary | ICD-10-CM | POA: Diagnosis not present

## 2016-05-13 DIAGNOSIS — G4733 Obstructive sleep apnea (adult) (pediatric): Secondary | ICD-10-CM | POA: Diagnosis not present

## 2016-05-20 ENCOUNTER — Telehealth: Payer: Self-pay | Admitting: Family Medicine

## 2016-05-20 NOTE — Telephone Encounter (Signed)
I prefer [facility] in [place]. Some of these physicians come to [place] now as well.

## 2016-05-20 NOTE — Telephone Encounter (Signed)
Pt called and wanted to find out the names of the Urologist that Dr. Lacinda Axon had recommended to him back in October. Please advise, thank you!  Call pt @ [phone]

## 2016-05-20 NOTE — Telephone Encounter (Signed)
Pt was advised of recommendations on [facility]. Pt will call back with who he will see and will need referral.

## 2016-06-27 ENCOUNTER — Other Ambulatory Visit: Payer: Self-pay | Admitting: Family Medicine

## 2016-08-12 ENCOUNTER — Other Ambulatory Visit: Payer: Self-pay | Admitting: Cardiovascular Disease

## 2016-08-19 ENCOUNTER — Other Ambulatory Visit: Payer: Self-pay | Admitting: Family Medicine

## 2016-08-19 NOTE — Telephone Encounter (Signed)
Pt called back and asked if Dr. Lacinda Axon could put in a referral to the urologist for him. One that you would recommend. Please advise, thank you!

## 2016-08-22 ENCOUNTER — Other Ambulatory Visit: Payer: Self-pay | Admitting: Family Medicine

## 2016-08-22 DIAGNOSIS — R972 Elevated prostate specific antigen [PSA]: Secondary | ICD-10-CM

## 2016-08-23 ENCOUNTER — Encounter: Payer: Self-pay | Admitting: Cardiovascular Disease

## 2016-08-23 ENCOUNTER — Ambulatory Visit (INDEPENDENT_AMBULATORY_CARE_PROVIDER_SITE_OTHER): Payer: 59 | Admitting: Cardiovascular Disease

## 2016-08-23 VITALS — BP 120/68 | HR 74 | Ht 72.0 in | Wt 246.5 lb

## 2016-08-23 DIAGNOSIS — I10 Essential (primary) hypertension: Secondary | ICD-10-CM | POA: Diagnosis not present

## 2016-08-23 DIAGNOSIS — I4891 Unspecified atrial fibrillation: Secondary | ICD-10-CM

## 2016-08-23 DIAGNOSIS — E785 Hyperlipidemia, unspecified: Secondary | ICD-10-CM | POA: Diagnosis not present

## 2016-08-23 MED ORDER — NEBIVOLOL HCL 5 MG PO TABS: 5.0000 mg | ORAL_TABLET | Freq: Every day | ORAL | 3 refills | Status: DC

## 2016-08-23 MED ORDER — ROSUVASTATIN CALCIUM 5 MG PO TABS: 5.0000 mg | ORAL_TABLET | Freq: Every day | ORAL | 3 refills | Status: DC

## 2016-08-23 MED ORDER — APIXABAN 5 MG PO TABS: 5.0000 mg | ORAL_TABLET | Freq: Two times a day (BID) | ORAL | 3 refills | Status: DC

## 2016-08-23 MED ORDER — ENALAPRIL MALEATE 5 MG PO TABS: 5.0000 mg | ORAL_TABLET | Freq: Every day | ORAL | 3 refills | Status: DC

## 2016-08-23 NOTE — Patient Instructions (Signed)

## 2016-08-23 NOTE — Progress Notes (Signed)
Patient ID: Brandon [name], male   DOB: June 05, 1955, 61 y.o.   MRN: [id number] Cardiology Office Note  Date:  08/23/2016   ID:  Brandon [name], DOB 1955/07/07, MRN [id number]  PCP:  Brandon Spikes, DO   Chief Complaint  Patient presents with  . other    12 month follow up. Meds reviewed by the pt. verbally. "doing well.'     HPI:  [name] is a very pleasant 61 year old gentleman with a history of  atrial fibrillation,  sleep apnea, wears CPAP  diabetes type 2 with hemoglobin A1c 10.8 in September 2014, hemoglobin A1c recently 6.5 presents for followup of his atrial fibrillation  In follow-up, he denies having any palpitations concerning for atrial fibrillation. Reports that he had side effects on metoprolol, denies having significant side effects on the bystolic Denies any chest pain concerning for angina No regular exercise program Some stress at work would like to retire in the next 3 years  EKG on today's visit shows normal sinus rhythmwith rate 74 bpm, no significant ST or T-wave changes  Prior medical history hospitalization 11/29/2012. History of alcohol  Magnesium was low on arrival. He was started on metoprolol tartrate, anticoagulation with eliquis. Also diagnosed with diabetes type 2 with hemoglobin A1c 10.8. He converted to normal sinus rhythm during his hospital course  Echocardiogram showed ejection fraction 60-65%, normal RV function, mild MR, moderately elevated right ventricular systolic pressures. Liver enzymes were mildly elevated. Glucose 236  PMH:   has a past medical history of A-fib ([place]); Cancer ([place]); Chicken pox; GERD (gastroesophageal reflux disease); Hyperlipidemia; Hypertension; Sleep apnea; and Type 2 diabetes mellitus ([place]).  PSH:    Past Surgical History:  Procedure Laterality Date  . APPENDECTOMY    . COLONOSCOPY WITH PROPOFOL N/A 08/17/2015   Procedure: COLONOSCOPY WITH PROPOFOL;  Surgeon: Lollie Sails, MD;  Location: [facility] ENDOSCOPY;   Service: Endoscopy;  Laterality: N/A;  . HERNIA REPAIR      Current Outpatient Prescriptions  Medication Sig Dispense Refill  . apixaban (ELIQUIS) 5 MG TABS tablet Take 1 tablet (5 mg total) by mouth 2 (two) times daily. 180 tablet 3  . Ascorbic Acid (VITAMIN C) 1000 MG tablet Take 1,000 mg by mouth daily.    Marland Kitchen CIALIS 5 MG tablet TAKE 1 TABLET BY MOUTH EVERY DAY AS NEEDED FOR ERECTILE DYSFUNCTION 30 tablet 0  . CINNAMON PO Take by mouth 3 (three) times daily.    . enalapril (VASOTEC) 5 MG tablet Take 1 tablet (5 mg total) by mouth daily. 90 tablet 3  . glucose blood (ACCU-CHEK ACTIVE STRIPS) test strip Use as instructed 100 each 12  . metFORMIN (GLUCOPHAGE) 500 MG tablet TAKE 1 TABLET BY MOUTH TWICE DAILY WITH A MEAL 180 tablet 1  . Multiple Vitamins-Minerals (MEGA BASIC PO) Take by mouth.    . nebivolol (BYSTOLIC) 5 MG tablet Take 1 tablet (5 mg total) by mouth daily. 90 tablet 3  . Omega-3 Fatty Acids (FISH OIL PO) Take by mouth daily.    . ranitidine (ZANTAC) 75 MG tablet Take 75 mg by mouth 2 (two) times daily.    . rosuvastatin (CRESTOR) 5 MG tablet Take 1 tablet (5 mg total) by mouth daily. 90 tablet 3   No current facility-administered medications for this visit.      Allergies:   Penicillin g and Penicillins   Social History:  The patient  reports that he has never smoked. He has never used smokeless tobacco. He  reports that he drinks about 3.0 oz of alcohol per week . He reports that he does not use drugs.   Family History:   family history includes Alcohol abuse in his father; Diabetes in his maternal grandmother and mother; Prostate cancer in his father.    Review of Systems: Review of Systems  Constitutional: Negative.   Respiratory: Negative.   Cardiovascular: Negative.   Gastrointestinal: Negative.   Musculoskeletal: Negative.   Neurological: Negative.   Psychiatric/Behavioral: Negative.   All other systems reviewed and are negative.    PHYSICAL EXAM: VS:  BP  120/68 (BP Location: Left Arm, Patient Position: Sitting, Cuff Size: Normal)   Pulse 74   Ht 6' (1.829 m)   Wt 246 lb 8 oz (111.8 kg)   BMI 33.43 kg/m  , BMI Body mass index is 33.43 kg/m. GEN: Well nourished, well developed, in no acute distress,obese  HEENT: normal  Neck: no JVD, carotid bruits, or masses Cardiac: RRR; no murmurs, rubs, or gallops,no edema  Respiratory:  clear to auscultation bilaterally, normal work of breathing GI: soft, nontender, nondistended, + BS MS: no deformity or atrophy  Skin: warm and dry, no rash Neuro:  Strength and sensation are intact Psych: euthymic mood, full affect    Recent Labs: No results found for requested labs within last 8760 hours.    Lipid Panel Lab Results  Component Value Date   CHOL 134 12/08/2015   HDL 51.00 12/08/2015   LDLCALC 61 12/08/2015   TRIG 109.0 12/08/2015      Wt Readings from Last 3 Encounters:  08/23/16 246 lb 8 oz (111.8 kg)  12/08/15 244 lb 8 oz (110.9 kg)  08/28/15 233 lb 12 oz (106 kg)       ASSESSMENT AND PLAN:  Atrial fibrillation, unspecified type ([place]) - Plan: EKG 12-Lead Maintaining normal sinus rhythm, Tolerating bystolic 5 mg daily and anticoagulation Had side effects previously on metoprolol  Benign essential HTN Blood pressure is well controlled on today's visit. No changes made to the medications.  Type 2 diabetes mellitus with complication, without long-term current use of insulin (HCC) Hemoglobin A1c improved 8 months ago  Sleep apnea Compliant with his CPAP  Hyperlipidemia Cholesterol is at goal on the current lipid regimen. No changes to the medications were made.   Total encounter time more than 15 minutes  Greater than 50% was spent in counseling and coordination of care with the patient   Disposition:   F/U  12 months   Orders Placed This Encounter  Procedures  . EKG 12-Lead     Signed, Esmond Plants, M.D., Ph.D. 08/23/2016  [place],  [place]

## 2016-08-24 ENCOUNTER — Other Ambulatory Visit: Payer: Self-pay | Admitting: Cardiovascular Disease

## 2016-08-25 ENCOUNTER — Ambulatory Visit (INDEPENDENT_AMBULATORY_CARE_PROVIDER_SITE_OTHER): Payer: 59 | Admitting: Family Medicine

## 2016-08-25 ENCOUNTER — Encounter: Payer: Self-pay | Admitting: Family Medicine

## 2016-08-25 VITALS — BP 130/84 | HR 77 | Temp 99.0°F | Resp 12 | Ht 72.0 in | Wt 246.4 lb

## 2016-08-25 DIAGNOSIS — G4733 Obstructive sleep apnea (adult) (pediatric): Secondary | ICD-10-CM

## 2016-08-25 DIAGNOSIS — E785 Hyperlipidemia, unspecified: Secondary | ICD-10-CM | POA: Diagnosis not present

## 2016-08-25 DIAGNOSIS — I4891 Unspecified atrial fibrillation: Secondary | ICD-10-CM | POA: Diagnosis not present

## 2016-08-25 DIAGNOSIS — E119 Type 2 diabetes mellitus without complications: Secondary | ICD-10-CM | POA: Diagnosis not present

## 2016-08-25 DIAGNOSIS — R972 Elevated prostate specific antigen [PSA]: Secondary | ICD-10-CM

## 2016-08-25 DIAGNOSIS — Z130 Encounter for screening for diseases of the blood and blood-forming organs and certain disorders involving the immune mechanism: Secondary | ICD-10-CM

## 2016-08-25 DIAGNOSIS — I10 Essential (primary) hypertension: Secondary | ICD-10-CM

## 2016-08-25 LAB — COMPREHENSIVE METABOLIC PANEL
ALBUMIN: 4.7 g/dL (ref 3.5–5.2)
ALT: 31 U/L (ref 0–53)
AST: 19 U/L (ref 0–37)
Alkaline Phosphatase: 47 U/L (ref 39–117)
BUN: 18 mg/dL (ref 6–23)
CALCIUM: 10 mg/dL (ref 8.4–10.5)
CHLORIDE: 102 meq/L (ref 96–112)
CO2: 29 meq/L (ref 19–32)
Creatinine, Ser: 0.81 mg/dL (ref 0.40–1.50)
GFR: 103.65 mL/min (ref >60.00)
Glucose, Bld: 143 mg/dL — ABNORMAL HIGH (ref 70–99)
POTASSIUM: 4.4 meq/L (ref 3.5–5.1)
SODIUM: 139 meq/L (ref 135–145)
Total Bilirubin: 0.7 mg/dL (ref 0.2–1.2)
Total Protein: 6.6 g/dL (ref 6.0–8.3)

## 2016-08-25 LAB — CBC
HCT: 44.3 % (ref 39.0–52.0)
Hemoglobin: 14.9 g/dL (ref 13.0–17.0)
MCHC: 33.7 g/dL (ref 30.0–36.0)
MCV: 94.9 fl (ref 78.0–100.0)
PLATELETS: 259 10*3/uL (ref 150.0–400.0)
RBC: 4.67 Mil/uL (ref 4.22–5.81)
RDW: 14.1 % (ref 11.5–15.5)
WBC: 9.2 10*3/uL (ref 4.0–10.5)

## 2016-08-25 LAB — PSA: PSA: 5.24 ng/mL — ABNORMAL HIGH (ref 0.10–4.00)

## 2016-08-25 LAB — LIPID PANEL
CHOL/HDL RATIO: 3
CHOLESTEROL: 140 mg/dL (ref 0–200)
HDL: 40.8 mg/dL (ref >39.00)
LDL CALC: 73 mg/dL (ref 0–99)
NonHDL: 99.03
TRIGLYCERIDES: 130 mg/dL (ref 0.0–149.0)
VLDL: 26 mg/dL (ref 0.0–40.0)

## 2016-08-25 LAB — HEMOGLOBIN A1C: Hgb A1c MFr Bld: 6.9 % — ABNORMAL HIGH (ref 4.6–6.5)

## 2016-08-25 NOTE — Assessment & Plan Note (Signed)
Has been stable on metformin. A1c today.

## 2016-08-25 NOTE — Assessment & Plan Note (Signed)
Stable on enalapril and bystolic. Continue.

## 2016-08-25 NOTE — Progress Notes (Signed)
Pre-visit discussion using our clinic review tool. No additional management support is needed unless otherwise documented below in the visit note.  

## 2016-08-25 NOTE — Assessment & Plan Note (Signed)
Compliant with CPAP 

## 2016-08-25 NOTE — Assessment & Plan Note (Signed)
At goal on Crestor. Continue. Labs today.

## 2016-08-25 NOTE — Assessment & Plan Note (Signed)
Stable Eliquis and Bystolic. Doing well at this time.

## 2016-08-25 NOTE — Progress Notes (Signed)
Subjective:  Patient ID: Brandon [name], male    DOB: January 29, 1956  Age: 61 y.o. MRN: [id number]  CC: Follow up  HPI:  61 year old male with atrial fibrillation, hypertension, OSA, DM 2, hyperlipidemia presents for follow up.  Atrial fibrillation  Stable on Eliquis, Bystolic.  HTN  Stable on Bystolic, Enalapril.  OSA  Stable on CPAP.  DM-2  Has been well controlled on Metformin.  Needs A1C.  HLD  Has been at goal on Crestor. Labs today.  Social Hx   Social History   Social History  . Marital status: Married    Spouse name: N/A  . Number of children: N/A  . Years of education: N/A   Social History Main Topics  . Smoking status: Never Smoker  . Smokeless tobacco: Never Used  . Alcohol use 3.0 oz/week    5 Cans of beer per week     Comment: social drinker  . Drug use: No  . Sexual activity: Yes    Partners: Female   Other Topics Concern  . None   Social History Narrative  . None    Review of Systems  Constitutional: Negative.   Respiratory: Negative.   Cardiovascular: Negative.   Genitourinary:       Decrease in urinary stream.   Objective:  BP 130/84 (BP Location: Left Arm, Patient Position: Sitting, Cuff Size: Large)   Pulse 77   Temp 99 F (37.2 C) (Oral)   Resp 12   Ht 6' (1.829 m)   Wt 246 lb 6.4 oz (111.8 kg)   SpO2 97%   BMI 33.42 kg/m   BP/Weight 08/25/2016 08/23/2016 05/10/91  Systolic BP 818 299 371  Diastolic BP 84 68 88  Wt. (Lbs) 246.4 246.5 244.5  BMI 33.42 33.43 33.16    Physical Exam  Constitutional: He is oriented to person, place, and time. He appears well-developed. No distress.  Cardiovascular: Normal rate and regular rhythm.   No murmur heard. Pulmonary/Chest: Effort normal and breath sounds normal. He has no wheezes. He has no rales.  Neurological: He is alert and oriented to person, place, and time.  Psychiatric: He has a normal mood and affect.  Vitals reviewed.  Lab Results  Component Value Date   WBC 12.2  (H) 11/29/2012   HGB 14.9 11/29/2012   HCT 42.7 11/29/2012   PLT 261 11/29/2012   GLUCOSE 236 (H) 11/29/2012   CHOL 134 12/08/2015   TRIG 109.0 12/08/2015   HDL 51.00 12/08/2015   LDLCALC 61 12/08/2015   ALT 101 (H) 11/29/2012   AST 59 (H) 11/29/2012   NA 136 11/29/2012   K 3.8 11/29/2012   CL 105 11/29/2012   CREATININE 0.84 11/29/2012   BUN 20 (H) 11/29/2012   CO2 28 11/29/2012   TSH 2.58 11/28/2012   PSA 4.19 (H) 12/08/2015   INR 1.0 11/28/2012   HGBA1C 6.5 12/08/2015    Assessment & Plan:   Problem List Items Addressed This Visit      Cardiovascular and Mediastinum   A-fib ([place]) - Primary    Stable Eliquis and Bystolic. Doing well at this time.      Benign essential HTN    Stable on enalapril and bystolic. Continue.         Respiratory   Sleep apnea    Compliant with CPAP.        Endocrine   DM (diabetes mellitus), type 2 ([place])    Has been stable on metformin. A1c today.  Relevant Orders   Hemoglobin A1c   Comprehensive metabolic panel     Other   Hyperlipidemia    At goal on Crestor. Continue. Labs today.      Relevant Orders   Lipid panel    Other Visit Diagnoses    Screening for deficiency anemia       Relevant Orders   CBC   Elevated PSA       Relevant Orders   PSA      Follow-up: 6 months  [place] DO [facility]

## 2016-08-25 NOTE — Patient Instructions (Signed)
Continue your meds.  We will call with your labs.  Follow up in 6 months.  Take care  Dr. Lacinda Axon

## 2016-09-14 ENCOUNTER — Encounter: Payer: Self-pay | Admitting: Urology

## 2016-09-14 ENCOUNTER — Ambulatory Visit: Payer: 59 | Admitting: Urology

## 2016-09-14 VITALS — BP 118/72 | HR 76 | Ht 72.0 in | Wt 249.5 lb

## 2016-09-14 DIAGNOSIS — R972 Elevated prostate specific antigen [PSA]: Secondary | ICD-10-CM

## 2016-09-14 DIAGNOSIS — N400 Enlarged prostate without lower urinary tract symptoms: Secondary | ICD-10-CM

## 2016-09-14 DIAGNOSIS — N430 Encysted hydrocele: Secondary | ICD-10-CM

## 2016-09-14 NOTE — Progress Notes (Signed)
09/14/2016 10:11 AM   Brandon [name] November 15, 1955 [id number]  Referring provider: Coral Spikes, DO [address] [place], [place] [address]  Chief Complaint  Patient presents with  . Elevated PSA    HPI: The patient is a 61 year old gentleman who presents today for evaluation of an elevated PSA.  1. Elevated PSA   His PSA was 5.24 in June 2018. It was 4.19 in October 2017. Patient does have history of previous prostate biopsy for a PSA of 3.8 2015. There were atypical cells noted at the right apex. Repeat biopsy was recommended that the patient did not undergo this. He does have a strong family history of prostate cancer in father who died from prostate cancer at the age of 3.  2. BPH IPSS: 6/2. Minor complaints in every urinary category except urgency. Nocturia x1. Minimal bother from urinary symptoms.  3. Left hydrocele Patient with known complex left hydrocele diagnosed on exam and confirmed with scrotal ultrasound 2015. He is currently not bothered by this.  PMH: Past Medical History:  Diagnosis Date  . A-fib ([place])   . Cancer ([place])    skin  . Chicken pox   . GERD (gastroesophageal reflux disease)   . Hyperlipidemia   . Hypertension   . Sleep apnea   . Type 2 diabetes mellitus [facility])     Surgical History: Past Surgical History:  Procedure Laterality Date  . APPENDECTOMY    . COLONOSCOPY WITH PROPOFOL N/A 08/17/2015   Procedure: COLONOSCOPY WITH PROPOFOL;  Surgeon: Lollie Sails, MD;  Location[facility] END[facility]COPY;  Service: Endoscopy;  Laterality: N/A;  . HERNIA REPAIR      Home Medications:  Allergies as of 09/14/2016      Reactions   Penicillin G Anaphylaxis   Penicillins       Medication List       Accurate as of 09/14/16 10:11 AM. Always use your most recent med list.          apixaban 5 MG Tabs tablet Commonly known as:  ELIQUIS Take 1 tablet (5 mg total) by mouth 2 (two) times daily.   CIALIS 5 MG tablet Generic drug:  tadalafil TAKE  1 TABLET BY MOUTH EVERY DAY AS NEEDED FOR ERECTILE DYSFUNCTION   CINNAMON PO Take by mouth 3 (three) times daily.   enalapril 5 MG tablet Commonly known as:  VASOTEC Take 1 tablet (5 mg total) by mouth daily.   FISH OIL PO Take by mouth daily.   glucose blood test strip Commonly known as:  ACCU-CHEK ACTIVE STRIPS Use as instructed   MEGA BASIC PO Take by mouth.   metFORMIN 500 MG tablet Commonly known as:  GLUCOPHAGE TAKE 1 TABLET BY MOUTH TWICE DAILY WITH A MEAL   nebivolol 5 MG tablet Commonly known as:  BYSTOLIC Take 1 tablet (5 mg total) by mouth daily.   ranitidine 75 MG tablet Commonly known as:  ZANTAC Take 75 mg by mouth 2 (two) times daily.   rosuvastatin 5 MG tablet Commonly known as:  CRESTOR Take 1 tablet (5 mg total) by mouth daily.   vitamin C 1000 MG tablet Take 1,000 mg by mouth daily.       Allergies:  Allergies  Allergen Reactions  . Penicillin G Anaphylaxis  . Penicillins     Family History: Family History  Problem Relation Age of Onset  . Diabetes Mother   . Alcohol abuse Father   . Prostate cancer Father   . Diabetes  Maternal Grandmother   . Bladder Cancer Neg Hx   . Kidney cancer Neg Hx     Social History:  reports that he has never smoked. He has never used smokeless tobacco. He reports that he drinks about 3.0 oz of alcohol per week . He reports that he does not use drugs.  ROS: UROLOGY Frequent Urination?: No Hard to postpone urination?: No Burning/pain with urination?: No Get up at night to urinate?: No Leakage of urine?: No Urine stream starts and stops?: Yes Trouble starting stream?: Yes Do you have to strain to urinate?: No Blood in urine?: No Urinary tract infection?: No Sexually transmitted disease?: No Injury to kidneys or bladder?: No Painful intercourse?: No Weak stream?: No Erection problems?: Yes Penile pain?: No  Gastrointestinal Nausea?: No Vomiting?: No Indigestion/heartburn?: No Diarrhea?:  No Constipation?: No  Constitutional Fever: No Night sweats?: No Weight loss?: No Fatigue?: Yes  Skin Skin rash/lesions?: No Itching?: No  Eyes Blurred vision?: No Double vision?: No  Ears/Nose/Throat Sore throat?: No Sinus problems?: No  Hematologic/Lymphatic Swollen glands?: No Easy bruising?: No  Cardiovascular Leg swelling?: No Chest pain?: No  Respiratory Cough?: No Shortness of breath?: No  Endocrine Excessive thirst?: No  Musculoskeletal Back pain?: No Joint pain?: No  Neurological Headaches?: No Dizziness?: No  Psychologic Depression?: No Anxiety?: No  Physical Exam: BP 118/72 (BP Location: Left Arm, Patient Position: Sitting, Cuff Size: Normal)   Pulse 76   Ht 6' (1.829 m)   Wt 249 lb 8 oz (113.2 kg)   BMI 33.84 kg/m   Constitutional:  Alert and oriented, No acute distress. HEENT: [place] AT, moist mucus membranes.  Trachea midline, no masses. Cardiovascular: No clubbing, cyanosis, or edema. Respiratory: Normal respiratory effort, no increased work of breathing. GI: Abdomen is soft, nontender, nondistended, no abdominal masses GU: No CVA tenderness. Normal phallus. Normal right testicle. Left hydrocele noted as on ultrasound. Approximately 4 cm in size. Skin: No rashes, bruises or suspicious lesions. Lymph: No cervical or inguinal adenopathy. Neurologic: Grossly intact, no focal deficits, moving all 4 extremities. Psychiatric: Normal mood and affect.  Laboratory Data: Lab Results  Component Value Date   WBC 9.2 08/25/2016   HGB 14.9 08/25/2016   HCT 44.3 08/25/2016   MCV 94.9 08/25/2016   PLT 259.0 08/25/2016    Lab Results  Component Value Date   CREATININE 0.81 08/25/2016    Lab Results  Component Value Date   PSA 5.24 (H) 08/25/2016   PSA 4.19 (H) 12/08/2015    No results found for: TESTOSTERONE  Lab Results  Component Value Date   HGBA1C 6.9 (H) 08/25/2016    Urinalysis    Component Value Date/Time   COLORURINE  Yellow 11/28/2012 2258   APPEARANCEUR Clear 11/28/2012 2258   LABSPEC 1.036 11/28/2012 2258   PHURINE 5.0 11/28/2012 2258   GLUCOSEU >=500 11/28/2012 2258   HGBUR Negative 11/28/2012 2258   BILIRUBINUR Negative 11/28/2012 2258   KETONESUR Trace 11/28/2012 2258   PROTEINUR Negative 11/28/2012 2258   NITRITE Negative 11/28/2012 2258   LEUKOCYTESUR Negative 11/28/2012 2258    Assessment & Plan:  1. Elevated PSA/history of atypica on previous prostate biopsy  I discussed the patient has PSA has risen since his previous prostate biopsy in that he had atypia on that biopsy which alone is indication for repeat prostate biopsy. We discussed the risks, benefits, indications of this procedure. He understands the risks include but are not limited to bleeding and infection. He understands a blood in his  stool, urine, and semen. He also understands the risk of infection requiring IV antibiotics in the hospital of approximately 1%. He is agreeable to proceeding.  2. BPH Asymptomatic. No treatment necessary.  3. Left hydrocele Asymptomatic. No treatment necessary.  Return for prostate biopsy.  Nickie Retort, MD  [facility] [address] [place], [place] [address] [phone]

## 2016-09-15 ENCOUNTER — Telehealth: Payer: Self-pay | Admitting: Cardiovascular Disease

## 2016-09-15 NOTE — Telephone Encounter (Signed)
Received cardiac clearance request for pt to proceed w/ prostate biopsy on 10/07/16.  Pt needs to stop Eliquis 2 days prior to procedure. Please route clearance and recommendations to [place] @ [phone].

## 2016-09-15 NOTE — Telephone Encounter (Signed)
Ok to hold eliquis for 2 days Restart after procedure

## 2016-09-16 NOTE — Telephone Encounter (Signed)
Routed to fax # provided. 

## 2016-10-06 ENCOUNTER — Other Ambulatory Visit: Payer: Self-pay | Admitting: Urology

## 2016-10-06 DIAGNOSIS — R972 Elevated prostate specific antigen [PSA]: Secondary | ICD-10-CM | POA: Diagnosis not present

## 2016-10-07 ENCOUNTER — Ambulatory Visit (INDEPENDENT_AMBULATORY_CARE_PROVIDER_SITE_OTHER): Payer: 59 | Admitting: Urology

## 2016-10-07 ENCOUNTER — Encounter: Payer: Self-pay | Admitting: Urology

## 2016-10-07 VITALS — BP 122/82 | HR 79 | Wt 241.2 lb

## 2016-10-07 DIAGNOSIS — R972 Elevated prostate specific antigen [PSA]: Secondary | ICD-10-CM

## 2016-10-07 MED ORDER — GENTAMICIN SULFATE 40 MG/ML IJ SOLN
80.0000 mg | Freq: Once | INTRAMUSCULAR | Status: AC
  Administered 2016-10-07: 80 mg via INTRAMUSCULAR

## 2016-10-07 MED ORDER — LEVOFLOXACIN 500 MG PO TABS
500.0000 mg | ORAL_TABLET | Freq: Once | ORAL | Status: AC
  Administered 2016-10-07: 500 mg via ORAL

## 2016-10-07 NOTE — Progress Notes (Signed)
Prostate Biopsy Procedure   Informed consent was obtained after discussing risks/benefits of the procedure.  A time out was performed to ensure correct patient identity.  Pre-Procedure: - Last PSA Level:  Lab Results  Component Value Date   PSA 5.24 (H) 08/25/2016   PSA 4.19 (H) 12/08/2015   - Gentamicin given prophylactically - Levaquin 500 mg administered PO -Transrectal Ultrasound performed revealing a 55.16 gm prostate -No significant hypoechoic or median lobe noted  Procedure: - Prostate block performed using 10 cc 1% lidocaine and biopsies taken from sextant areas, a total of 12 under ultrasound guidance.  Post-Procedure: - Patient tolerated the procedure well - He was counseled to seek immediate medical attention if experiences any severe pain, significant bleeding, or fevers - Return in one week to discuss biopsy results

## 2016-10-10 ENCOUNTER — Encounter: Payer: Self-pay | Admitting: Family Medicine

## 2016-10-12 ENCOUNTER — Other Ambulatory Visit: Payer: Self-pay | Admitting: Urology

## 2016-10-12 LAB — PATHOLOGY REPORT

## 2016-10-18 ENCOUNTER — Ambulatory Visit (INDEPENDENT_AMBULATORY_CARE_PROVIDER_SITE_OTHER): Payer: 59 | Admitting: Urology

## 2016-10-18 ENCOUNTER — Encounter: Payer: Self-pay | Admitting: Urology

## 2016-10-18 VITALS — BP 128/83 | HR 72 | Ht 72.0 in | Wt 246.8 lb

## 2016-10-18 DIAGNOSIS — R972 Elevated prostate specific antigen [PSA]: Secondary | ICD-10-CM

## 2016-10-18 NOTE — Progress Notes (Signed)
10/18/2016 4:29 PM   [place] 1956-02-07 [id number]  Referring provider: Coral Spikes, DO [address] [place], [place] [address]  No chief complaint on file.   HPI: 1. Elevated PSA   His PSA was 5.24 in June 2018. He underwent repeat biopsy 10/06/2016 which was benign with a 55 gram prostate. PSA was 4.19 in October 2017. Patient does have history of previous prostate biopsy for a PSA of 3.8 2015. There were atypical cells noted at the right apex. Repeat biopsy was recommended that the patient did not undergo this. He does have a strong family history of prostate cancer in father who died from prostate cancer at the age of 3.   2. BPH IPSS: 6/2. Minor complaints in every urinary category except urgency. Nocturia x1. Minimal bother from urinary symptoms. Prostate 55 g on imaging.   3. Left hydrocele Patient with known complex left hydrocele diagnosed on exam and confirmed with scrotal ultrasound 2015. He is currently not bothered by this.  Today, pt returns to discuss above. He's been well since the biopsy. He c/o perineal discomfort even before the biopsy. Ibuprofen helps.    PMH: Past Medical History:  Diagnosis Date  . A-fib ([place])   . Cancer ([place])    skin  . Chicken pox   . GERD (gastroesophageal reflux disease)   . Hyperlipidemia   . Hypertension   . Sleep apnea   . Type 2 diabetes mellitus [facility])     Surgical History: Past Surgical History:  Procedure Laterality Date  . APPENDECTOMY    . COLONOSCOPY WITH PROPOFOL N/A 08/17/2015   Procedure: COLONOSCOPY WITH PROPOFOL;  Surgeon: Lollie Sails, MD;  Location: [facility] ENDOSCOPY;  Service: Endoscopy;  Laterality: N/A;  . HERNIA REPAIR      Home Medications:  Allergies as of 10/18/2016      Reactions   Penicillin G Anaphylaxis   Penicillins       Medication List       Accurate as of 10/18/16  4:29 PM. Always use your most recent med list.          apixaban 5 MG Tabs tablet Commonly known  as:  ELIQUIS Take 1 tablet (5 mg total) by mouth 2 (two) times daily.   CIALIS 5 MG tablet Generic drug:  tadalafil TAKE 1 TABLET BY MOUTH EVERY DAY AS NEEDED FOR ERECTILE DYSFUNCTION   CINNAMON PO Take by mouth 3 (three) times daily.   enalapril 5 MG tablet Commonly known as:  VASOTEC Take 1 tablet (5 mg total) by mouth daily.   FISH OIL PO Take by mouth daily.   glucose blood test strip Commonly known as:  ACCU-CHEK ACTIVE STRIPS Use as instructed   MAGNESIUM PO Take by mouth.   MEGA BASIC PO Take by mouth.   metFORMIN 500 MG tablet Commonly known as:  GLUCOPHAGE TAKE 1 TABLET BY MOUTH TWICE DAILY WITH A MEAL   nebivolol 5 MG tablet Commonly known as:  BYSTOLIC Take 1 tablet (5 mg total) by mouth daily.   ranitidine 75 MG tablet Commonly known as:  ZANTAC Take 75 mg by mouth 2 (two) times daily.   rosuvastatin 5 MG tablet Commonly known as:  CRESTOR Take 1 tablet (5 mg total) by mouth daily.   vitamin C 1000 MG tablet Take 1,000 mg by mouth daily.       Allergies:  Allergies  Allergen Reactions  . Penicillin G Anaphylaxis  . Penicillins  Family History: Family History  Problem Relation Age of Onset  . Diabetes Mother   . Alcohol abuse Father   . Prostate cancer Father   . Diabetes Maternal Grandmother   . Bladder Cancer Neg Hx   . Kidney cancer Neg Hx     Social History:  reports that he has never smoked. He has never used smokeless tobacco. He reports that he drinks about 3.0 oz of alcohol per week . He reports that he does not use drugs.  ROS: UROLOGY Frequent Urination?: No Hard to postpone urination?: No Burning/pain with urination?: No Get up at night to urinate?: No Leakage of urine?: No Urine stream starts and stops?: No Trouble starting stream?: No Do you have to strain to urinate?: No Blood in urine?: No Urinary tract infection?: No Sexually transmitted disease?: No Injury to kidneys or bladder?: No Painful intercourse?:  No Weak stream?: No Erection problems?: No Penile pain?: No  Gastrointestinal Nausea?: No Vomiting?: No Indigestion/heartburn?: No Diarrhea?: No Constipation?: No  Constitutional Fever: No Night sweats?: No Weight loss?: No Fatigue?: No  Skin Skin rash/lesions?: No Itching?: No  Eyes Blurred vision?: No Double vision?: No  Ears/Nose/Throat Sore throat?: No Sinus problems?: No  Hematologic/Lymphatic Swollen glands?: No Easy bruising?: No  Cardiovascular Leg swelling?: No Chest pain?: No  Respiratory Cough?: No Shortness of breath?: No  Endocrine Excessive thirst?: No  Musculoskeletal Back pain?: No Joint pain?: No  Neurological Headaches?: No Dizziness?: No  Psychologic Depression?: No Anxiety?: No  Physical Exam: BP 128/83 (BP Location: Left Arm, Patient Position: Sitting, Cuff Size: Large)   Pulse 72   Ht 6' (1.829 m)   Wt 111.9 kg (246 lb 12.8 oz)   BMI 33.47 kg/m   Constitutional:  Alert and oriented, No acute distress. HEENT: [place] AT, moist mucus membranes.  Trachea midline, no masses. Cardiovascular: No clubbing, cyanosis, or edema. Respiratory: Normal respiratory effort, no increased work of breathing. GI: Abdomen is soft, nontender, nondistended, no abdominal masses Skin: No rashes, bruises or suspicious lesions. Neurologic: Grossly intact, no focal deficits, moving all 4 extremities. Psychiatric: Normal mood and affect.  Laboratory Data: Lab Results  Component Value Date   WBC 9.2 08/25/2016   HGB 14.9 08/25/2016   HCT 44.3 08/25/2016   MCV 94.9 08/25/2016   PLT 259.0 08/25/2016    Lab Results  Component Value Date   CREATININE 0.81 08/25/2016    Lab Results  Component Value Date   PSA 5.24 (H) 08/25/2016   PSA 4.19 (H) 12/08/2015    No results found for: TESTOSTERONE  Lab Results  Component Value Date   HGBA1C 6.9 (H) 08/25/2016    Urinalysis    Component Value Date/Time   COLORURINE Yellow 11/28/2012 2258     APPEARANCEUR Clear 11/28/2012 2258   LABSPEC 1.036 11/28/2012 2258   PHURINE 5.0 11/28/2012 2258   GLUCOSEU >=500 11/28/2012 2258   HGBUR Negative 11/28/2012 2258   BILIRUBINUR Negative 11/28/2012 2258   KETONESUR Trace 11/28/2012 2258   PROTEINUR Negative 11/28/2012 2258   NITRITE Negative 11/28/2012 2258   LEUKOCYTESUR Negative 11/28/2012 2258    Assessment & Plan:    PSA - two negative biopsies. For the perineal discomfort -- discussed exercise, stretching, warm baths, NSAIDs and tamsulosin. If continues he'll let us know and we'll plan UA and CT pelvis. He feels better already with the relief of no cancer on the biopsy. We'll see back in 6 months with a PSA.   There are no diagnoses linked  to this encounter.  No Follow-up on file.  Festus Aloe, [facility] [address], [place], [place] [address] [phone]

## 2016-10-23 ENCOUNTER — Other Ambulatory Visit: Payer: Self-pay | Admitting: Family Medicine

## 2016-11-04 DIAGNOSIS — G4733 Obstructive sleep apnea (adult) (pediatric): Secondary | ICD-10-CM | POA: Diagnosis not present

## 2016-12-11 ENCOUNTER — Other Ambulatory Visit: Payer: Self-pay | Admitting: Family Medicine

## 2017-01-09 ENCOUNTER — Other Ambulatory Visit: Payer: Self-pay | Admitting: Family Medicine

## 2017-01-29 ENCOUNTER — Other Ambulatory Visit: Payer: Self-pay | Admitting: Family Medicine

## 2017-01-30 NOTE — Telephone Encounter (Signed)
Last OV with DR.Lacinda Axon 08/25/16

## 2017-02-06 ENCOUNTER — Other Ambulatory Visit: Payer: Self-pay | Admitting: Family Medicine

## 2017-02-20 DIAGNOSIS — G43109 Migraine with aura, not intractable, without status migrainosus: Secondary | ICD-10-CM | POA: Diagnosis not present

## 2017-02-20 LAB — HM DIABETES EYE EXAM

## 2017-02-24 ENCOUNTER — Ambulatory Visit: Payer: 59 | Admitting: Family Medicine

## 2017-03-06 ENCOUNTER — Other Ambulatory Visit: Payer: Self-pay | Admitting: Family Medicine

## 2017-03-06 NOTE — Telephone Encounter (Signed)
Last OV with DR.Cook 08/25/16 last filled 02/07/17 180 0rf

## 2017-03-14 ENCOUNTER — Ambulatory Visit: Payer: 59 | Admitting: Internal Medicine

## 2017-03-14 ENCOUNTER — Encounter: Payer: Self-pay | Admitting: Internal Medicine

## 2017-03-14 VITALS — BP 118/78 | HR 77 | Temp 98.5°F | Resp 16 | Ht 71.25 in | Wt 242.1 lb

## 2017-03-14 DIAGNOSIS — H539 Unspecified visual disturbance: Secondary | ICD-10-CM

## 2017-03-14 DIAGNOSIS — I10 Essential (primary) hypertension: Secondary | ICD-10-CM

## 2017-03-14 DIAGNOSIS — Z1329 Encounter for screening for other suspected endocrine disorder: Secondary | ICD-10-CM

## 2017-03-14 DIAGNOSIS — E119 Type 2 diabetes mellitus without complications: Secondary | ICD-10-CM

## 2017-03-14 DIAGNOSIS — H543 Unqualified visual loss, both eyes: Secondary | ICD-10-CM | POA: Diagnosis not present

## 2017-03-14 DIAGNOSIS — H538 Other visual disturbances: Secondary | ICD-10-CM | POA: Diagnosis not present

## 2017-03-14 DIAGNOSIS — R972 Elevated prostate specific antigen [PSA]: Secondary | ICD-10-CM | POA: Diagnosis not present

## 2017-03-14 MED ORDER — METFORMIN HCL 500 MG PO TABS: ORAL_TABLET | ORAL | 0 refills | Status: DC

## 2017-03-14 MED ORDER — GLUCOSE BLOOD VI STRP: ORAL_STRIP | 3 refills | Status: DC

## 2017-03-14 NOTE — Patient Instructions (Signed)
We will order MRI of your brain  I would f/u urology by 10/2017  Labs today  If MRI negative I would consider seeing neurology for you symptoms  F/u 08/2017   Blurred Vision Having blurred vision means that you cannot see things clearly. Your vision may seem fuzzy or out of focus. Blurred vision is a very common symptom of an eye or vision problem. Blurred vision is often a gradual blur that occurs in one eye or both eyes. There are many causes of blurred vision, including cataracts, macular degeneration, and diabetic retinopathy. Blurred vision can be diagnosed based on your symptoms and a physical exam. Tell your health care provider about any other health problems you have, any recent eye injury, and any prior surgeries. You may need to see a health care provider who specializes in eye problems (ophthalmologist). Your treatment depends on what is causing your blurred vision.  HOME CARE INSTRUCTIONS  Tell your health care provider about any changes in your blurred vision.  Do not drive or operate heavy machinery if your vision is blurry.  Keep all follow-up visits as directed by your health care provider. This is important. SEEK MEDICAL CARE IF:  Your symptoms get worse.  You have new symptoms.  You have trouble seeing at night.  You have trouble seeing up close or far away.  You have trouble noticing the difference between colors. SEEK IMMEDIATE MEDICAL CARE IF:  You have severe eye pain.  You have a severe headache.  You have flashing lights in your field of vision.  You have a sudden change in vision.  You have a sudden loss of vision.  You have vision change after an injury.  You notice drainage coming from your eyes.  You notice a rash around your eyes. This information is not intended to replace advice given to you by your health care provider. Make sure you discuss any questions you have with your health care provider. Document Released: 02/24/2003 Document  Revised: 07/08/2014 Document Reviewed: 01/15/2014 Elsevier Interactive Patient Education  2017 [employer].

## 2017-03-14 NOTE — Progress Notes (Addendum)
Chief Complaint  Patient presents with  . Establish Care   F/u  1. He saw [facility] for DM eye exam and told them he is having "optical migraines at work" He had had x3 that last 20-30 minutes in am and around lunch. No eye watering or nose watering. No h/o migraines as kid but with episodes he has blurry vision and objects appear distorted. He checked his blood sugar but it was not low and they told him to mention to PCP 2. H/o prostate bx 2015 atypical cells per note and repeat bx 10/2016 neg FH prostate cancer in dad and with h/o elevated PSA rec f/u Urology 04/2017 which will be 6 months. He reports perinneal discomfort is 1-2/10 and somewhat improved.   3. DM 2 last A1C 6.9 controlled on metformin 500 mg bid he needs refill as well as test strips. Checking cbg 1x per day     Review of Systems  Constitutional: Negative for weight loss.  HENT: Negative for hearing loss.   Eyes: Positive for blurred vision.       +vision loss  B/l eyes   Respiratory: Negative for shortness of breath.   Cardiovascular: Negative for chest pain.  Gastrointestinal: Negative for abdominal pain.  Genitourinary:       +perinneal pain improved  Denies problems urinating   Musculoskeletal: Negative for falls.  Skin: Negative for rash.  Neurological: Negative for headaches.  Psychiatric/Behavioral: Negative for memory loss.   Past Medical History:  Diagnosis Date  . A-fib ([place])   . Cancer ([place])    skin  . Chicken pox   . Elevated PSA    follows [facility]   . GERD (gastroesophageal reflux disease)   . Hyperlipidemia   . Hypertension   . Sleep apnea   . Type 2 diabetes mellitus ([place])    Past Surgical History:  Procedure Laterality Date  . APPENDECTOMY    . COLONOSCOPY WITH PROPOFOL N/A 08/17/2015   Procedure: COLONOSCOPY WITH PROPOFOL;  Surgeon: Lollie Sails, MD;  Location[facility] END[facility]COPY;  Service: Endoscopy;  Laterality: N/A;  . HERNIA REPAIR    . PROSTATE BIOPSY     10/2016  negative multiple biopsies 2015 per note atypical cells unable to see this path report   Family History  Problem Relation Age of Onset  . Diabetes Mother   . Alcohol abuse Father   . Prostate cancer Father   . Cancer Father        prostate   . Diabetes Maternal Grandmother   . Bladder Cancer Neg Hx   . Kidney cancer Neg Hx    Social History   Socioeconomic History  . Marital status: Married    Spouse name: Not on file  . Number of children: Not on file  . Years of education: Not on file  . Highest education level: Not on file  Social Needs  . Financial resource strain: Not on file  . Food insecurity - worry: Not on file  . Food insecurity - inability: Not on file  . Transportation needs - medical: Not on file  . Transportation needs - non-medical: Not on file  Occupational History  . Not on file  Tobacco Use  . Smoking status: Never Smoker  . Smokeless tobacco: Never Used  Substance and Sexual Activity  . Alcohol use: Yes    Alcohol/week: 3.0 oz    Types: 5 Cans of beer per week    Comment: social drinker  . Drug use: No  .  Sexual activity: Yes    Partners: Female  Other Topics Concern  . Not on file  Social History Narrative   Married    Works in [employer]; never smoker    Current Meds  Medication Sig  . apixaban (ELIQUIS) 5 MG TABS tablet Take 1 tablet (5 mg total) by mouth 2 (two) times daily.  . Ascorbic Acid (VITAMIN C) 1000 MG tablet Take 1,000 mg by mouth daily.  Marland Kitchen CIALIS 5 MG tablet TAKE 1 TABLET BY MOUTH EVERY DAY AS NEEDED FOR ERECTILE DYSFUNCTION  . CINNAMON PO Take by mouth 3 (three) times daily.  . enalapril (VASOTEC) 5 MG tablet Take 1 tablet (5 mg total) by mouth daily.  Marland Kitchen glucose blood (ACCU-CHEK AVIVA PLUS) test strip USE AS DIRECTED TO TEST BLOOD SUGAR qam  . MAGNESIUM PO Take by mouth.  . metFORMIN (GLUCOPHAGE) 500 MG tablet TAKE 1 TABLET BY MOUTH TWICE DAILY WITH A MEAL  . Multiple Vitamins-Minerals (MEGA BASIC PO) Take by mouth.  .  nebivolol (BYSTOLIC) 5 MG tablet Take 1 tablet (5 mg total) by mouth daily.  . Omega-3 Fatty Acids (FISH OIL PO) Take by mouth daily.  . ranitidine (ZANTAC) 75 MG tablet Take 75 mg by mouth 2 (two) times daily.  . rosuvastatin (CRESTOR) 5 MG tablet Take 1 tablet (5 mg total) by mouth daily.  . [DISCONTINUED] ACCU-CHEK AVIVA PLUS test strip USE AS DIRECTED TO TEST BLOOD SUGAR TWICE DAILY  . [DISCONTINUED] metFORMIN (GLUCOPHAGE) 500 MG tablet TAKE 1 TABLET BY MOUTH TWICE DAILY WITH A MEAL   Allergies  Allergen Reactions  . Penicillin G Anaphylaxis  . Penicillins    No results found for this or any previous visit (from the past 2160 hour(s)). Objective  Body mass index is 33.53 kg/m. Wt Readings from Last 3 Encounters:  03/14/17 242 lb 2 oz (109.8 kg)  10/18/16 246 lb 12.8 oz (111.9 kg)  10/07/16 241 lb 3.2 oz (109.4 kg)   Temp Readings from Last 3 Encounters:  03/14/17 98.5 F (36.9 C) (Oral)  08/25/16 99 F (37.2 C) (Oral)  12/08/15 99 F (37.2 C) (Oral)   BP Readings from Last 3 Encounters:  03/14/17 118/78  10/18/16 128/83  10/07/16 122/82   Pulse Readings from Last 3 Encounters:  03/14/17 77  10/18/16 72  10/07/16 79   O2 sat 98% room air  Physical Exam  Constitutional: He is oriented to person, place, and time and well-developed, well-nourished, and in no distress.  HENT:  Head: Normocephalic and atraumatic.  Eyes: Conjunctivae are normal. Pupils are equal, round, and reactive to light.  Cardiovascular: Normal rate, regular rhythm and normal heart sounds.  Pulmonary/Chest: Effort normal and breath sounds normal.  Neurological: He is alert and oriented to person, place, and time. Gait normal.  Skin: Skin is warm and dry.  Psychiatric: Mood, memory, affect and judgment normal.  Nursing note and vitals reviewed.   Assessment   1. Blurry vision and intermittent loss of vision/distorted vision  2. DM 2 and HTN controlled  3. Elevated PSA bxs 10/2016 negative,  multiple  4. H m Plan  1. Will order MRI brain w and w/o contrast and MRA w/on brain If neg refer to neurology. Could also consider ESR/CRP w/u GCA, he had eye exam, consider US carotids. Cardiac evaluation if GCA and carotid dz excluded per up to date also consider holter and repeat echo  If brain ischemia + consider hypercoagulable w/u   2. Cont meds  Normal  foot exam today with DP/PT pulse and monofilament  rX refills metformin and strips  A1C, BMET, UA, protein, TSH, T4, PSA today  Get records [facility] saw 02/20/17 records obtained neg diabetic retinopathy  Lipid due 08/2016   4. Add on today  Referral due to f/u urology 04/20/17  perinneal pain is better slightly   4. Declines flu, pna 23, had Tdap  Declines hep C screening today will disc hep B/hIV testing for future  Colonoscopy had 08/17/15 multiple polyps [facility] tubular and hyperplastic would repeat in 5 years need to confirm with GI    Provider: Dr. Olivia Mackie McLean-Scocuzza-[facility] Medicine

## 2017-03-15 LAB — HEMOGLOBIN A1C: Hgb A1c MFr Bld: 6.7 % — ABNORMAL HIGH (ref 4.6–6.5)

## 2017-03-15 LAB — BASIC METABOLIC PANEL
BUN: 19 mg/dL (ref 6–23)
CHLORIDE: 102 meq/L (ref 96–112)
CO2: 27 mEq/L (ref 19–32)
Calcium: 9.6 mg/dL (ref 8.4–10.5)
Creatinine, Ser: 0.72 mg/dL (ref 0.40–1.50)
GFR: 118.52 mL/min (ref >60.00)
GLUCOSE: 127 mg/dL — AB (ref 70–99)
POTASSIUM: 4.3 meq/L (ref 3.5–5.1)
Sodium: 139 mEq/L (ref 135–145)

## 2017-03-15 LAB — URINALYSIS, ROUTINE W REFLEX MICROSCOPIC
Bilirubin Urine: NEGATIVE
Hgb urine dipstick: NEGATIVE
Ketones, ur: NEGATIVE
Leukocytes, UA: NEGATIVE
Nitrite: NEGATIVE
RBC / HPF: NONE SEEN (ref 0–?)
Specific Gravity, Urine: 1.01 (ref 1.000–1.030)
Total Protein, Urine: NEGATIVE
Urine Glucose: NEGATIVE
Urobilinogen, UA: 0.2 (ref 0.0–1.0)
WBC UA: NONE SEEN (ref 0–?)
pH: 7 (ref 5.0–8.0)

## 2017-03-15 LAB — TSH: TSH: 1.52 u[IU]/mL (ref 0.35–4.50)

## 2017-03-15 LAB — MICROALBUMIN / CREATININE URINE RATIO
CREATININE, U: 61.6 mg/dL
Microalb Creat Ratio: 1.1 mg/g (ref 0.0–30.0)
Microalb, Ur: <0.7 mg/dL (ref 0.0–1.9)

## 2017-03-15 LAB — T4, FREE: FREE T4: 0.92 ng/dL (ref 0.60–1.60)

## 2017-03-15 NOTE — Addendum Note (Signed)
Addended by: Orland Mustard on: 03/15/2017 03:39 PM   Modules accepted: Orders

## 2017-03-18 ENCOUNTER — Other Ambulatory Visit: Payer: Self-pay | Admitting: Family Medicine

## 2017-03-23 ENCOUNTER — Ambulatory Visit
Admission: RE | Admit: 2017-03-23 | Discharge: 2017-03-23 | Disposition: A | Discharge status: Home / Self Care | Payer: 59 | Source: Ambulatory Visit | Attending: Internal Medicine | Admitting: Internal Medicine

## 2017-03-23 ENCOUNTER — Ambulatory Visit: Admission: RE | Admit: 2017-03-23 | Payer: 59 | Source: Ambulatory Visit

## 2017-03-23 MED ORDER — GADOBENATE DIMEGLUMINE 529 MG/ML IV SOLN: 20.0000 mL | Freq: Once | INTRAVENOUS | Status: DC | PRN

## 2017-03-24 ENCOUNTER — Telehealth: Payer: Self-pay | Admitting: Internal Medicine

## 2017-03-24 NOTE — Telephone Encounter (Signed)
Please advise 

## 2017-03-24 NOTE — Telephone Encounter (Signed)
Copied from [place] [id number]. Topic: General - Other >> Mar 24, 2017  8:58 AM Valla Leaver wrote: Reason for CRM: Manuela Schwartz, w/ [facility] scheduling, calling to explain that they weren't unable to do the patients MRI yesterday b/c metal objects were discovered in his eyes. They now need an order from [facility] for an x-ray of his orbits before they can do the MRI.

## 2017-03-26 ENCOUNTER — Other Ambulatory Visit: Payer: Self-pay | Admitting: Internal Medicine

## 2017-03-26 DIAGNOSIS — S0542XA Penetrating wound of orbit with or without foreign body, left eye, initial encounter: Secondary | ICD-10-CM

## 2017-03-26 DIAGNOSIS — S0541XA Penetrating wound of orbit with or without foreign body, right eye, initial encounter: Secondary | ICD-10-CM

## 2017-03-26 NOTE — Telephone Encounter (Signed)
Call [facility] radiology  Xray b/l orbits placed   [place]

## 2017-03-27 ENCOUNTER — Other Ambulatory Visit: Payer: 59

## 2017-03-27 DIAGNOSIS — R972 Elevated prostate specific antigen [PSA]: Secondary | ICD-10-CM

## 2017-03-27 NOTE — Telephone Encounter (Signed)
Informed [facility]

## 2017-03-28 LAB — PSA: Prostate Specific Ag, Serum: 7.3 ng/mL — ABNORMAL HIGH (ref 0.0–4.0)

## 2017-03-30 ENCOUNTER — Ambulatory Visit: Payer: 59 | Admitting: Urology

## 2017-04-14 ENCOUNTER — Ambulatory Visit: Payer: 59 | Admitting: Urology

## 2017-04-14 ENCOUNTER — Encounter: Payer: Self-pay | Admitting: Urology

## 2017-04-14 VITALS — BP 144/85 | HR 76 | Ht 71.5 in | Wt 244.4 lb

## 2017-04-14 DIAGNOSIS — R972 Elevated prostate specific antigen [PSA]: Secondary | ICD-10-CM

## 2017-04-14 NOTE — Progress Notes (Signed)
04/14/2017 4:19 PM   Brandon [name] 08-01-1955 [id number]  Referring provider: McLean-Scocuzza, Nino Glow, MD [place], [place] [address]  Chief Complaint  Patient presents with  . Elevated PSA    HPI: The patient is a 62 year old gentleman who presents today for follow-up of an elevated PSA.  1. Elevated PSA His PSA was 5.24 in June 2018. He underwent repeat biopsy 10/06/2016 which was benign with a 55 gram prostate. PSA was 4.19 in October 2017. Patient does have history of previous prostate biopsy for a PSA of 3.8 in 2015. There were atypical cells noted at the right apex. Repeat biopsy was recommended that the patient eventually underwent which was negative in August 2018. He does have a strong family history of prostate cancer in father who died from prostate cancer at the age of 68.   However, his PSA continues to rise.  It is 7.3 in January 2019.  2. BPH Previous IPSS: 6/2. Minor complaints in every urinary category except urgency. Nocturia x1. Minimal bother from urinary symptoms. Prostate 55 g on imaging.   3. Left hydrocele Patient with known complex left hydrocele diagnosed on exam and confirmed with scrotal ultrasound 2015. He is currently not bothered by this.   PMH: Past Medical History:  Diagnosis Date  . A-fib ([place])   . Cancer ([place])    skin  . Chicken pox   . Elevated PSA    follows [facility]   . GERD (gastroesophageal reflux disease)   . Hyperlipidemia   . Hypertension   . Sleep apnea   . Type 2 diabetes mellitus [facility])     Surgical History: Past Surgical History:  Procedure Laterality Date  . APPENDECTOMY    . COLONOSCOPY WITH PROPOFOL N/A 08/17/2015   Procedure: COLONOSCOPY WITH PROPOFOL;  Surgeon: Lollie Sails, MD;  Location: [facility] ENDOSCOPY;  Service: Endoscopy;  Laterality: N/A;  . HERNIA REPAIR    . PROSTATE BIOPSY     10/2016 negative multiple biopsies 2015 per note atypical cells unable to see this path report     Home Medications:  Allergies as of 04/14/2017      Reactions   Penicillin G Anaphylaxis   Penicillins       Medication List        Accurate as of 04/14/17  4:19 PM. Always use your most recent med list.          ACCU-CHEK AVIVA PLUS test strip Generic drug:  glucose blood USE TWICE DAILY AS DIRECTED   apixaban 5 MG Tabs tablet Commonly known as:  ELIQUIS Take 1 tablet (5 mg total) by mouth 2 (two) times daily.   CIALIS 5 MG tablet Generic drug:  tadalafil TAKE 1 TABLET BY MOUTH EVERY DAY AS NEEDED FOR ERECTILE DYSFUNCTION   CINNAMON PO Take by mouth 3 (three) times daily.   enalapril 5 MG tablet Commonly known as:  VASOTEC Take 1 tablet (5 mg total) by mouth daily.   FISH OIL PO Take by mouth daily.   MAGNESIUM PO Take by mouth.   MEGA BASIC PO Take by mouth.   metFORMIN 500 MG tablet Commonly known as:  GLUCOPHAGE TAKE 1 TABLET BY MOUTH TWICE DAILY WITH A MEAL   nebivolol 5 MG tablet Commonly known as:  BYSTOLIC Take 1 tablet (5 mg total) by mouth daily.   ranitidine 75 MG tablet Commonly known as:  ZANTAC Take 75 mg by mouth 2 (two) times daily.   rosuvastatin 5 MG tablet  Commonly known as:  CRESTOR Take 1 tablet (5 mg total) by mouth daily.   vitamin C 1000 MG tablet Take 1,000 mg by mouth daily.       Allergies:  Allergies  Allergen Reactions  . Penicillin G Anaphylaxis  . Penicillins     Family History: Family History  Problem Relation Age of Onset  . Diabetes Mother   . Alcohol abuse Father   . Prostate cancer Father   . Cancer Father        prostate   . Diabetes Maternal Grandmother   . Bladder Cancer Neg Hx   . Kidney cancer Neg Hx     Social History:  reports that  has never smoked. he has never used smokeless tobacco. He reports that he drinks about 3.0 oz of alcohol per week. He reports that he does not use drugs.  ROS: UROLOGY Frequent Urination?: No Hard to postpone urination?: No Burning/pain with urination?:  No Get up at night to urinate?: No Leakage of urine?: No Urine stream starts and stops?: No Trouble starting stream?: No Do you have to strain to urinate?: No Blood in urine?: No Urinary tract infection?: No Sexually transmitted disease?: No Injury to kidneys or bladder?: No Painful intercourse?: No Weak stream?: No Erection problems?: No Penile pain?: No  Gastrointestinal Nausea?: No Vomiting?: No Indigestion/heartburn?: No Diarrhea?: No Constipation?: No  Constitutional Fever: No Night sweats?: No Weight loss?: No Fatigue?: No  Skin Skin rash/lesions?: No Itching?: No  Eyes Blurred vision?: No Double vision?: No  Ears/Nose/Throat Sore throat?: No Sinus problems?: No  Hematologic/Lymphatic Swollen glands?: No Easy bruising?: No  Cardiovascular Leg swelling?: No Chest pain?: No  Respiratory Cough?: No Shortness of breath?: No  Endocrine Excessive thirst?: No  Musculoskeletal Back pain?: No Joint pain?: No  Neurological Headaches?: No Dizziness?: No  Psychologic Depression?: No Anxiety?: No  Physical Exam: BP (!) 144/85 (BP Location: Right Arm, Patient Position: Sitting, Cuff Size: Normal)   Pulse 76   Ht 5' 11.5" (1.816 m)   Wt 244 lb 6.4 oz (110.9 kg)   BMI 33.61 kg/m   Constitutional:  Alert and oriented, No acute distress. HEENT: [place] AT, moist mucus membranes.  Trachea midline, no masses. Cardiovascular: No clubbing, cyanosis, or edema. Respiratory: Normal respiratory effort, no increased work of breathing. GI: Abdomen is soft, nontender, nondistended, no abdominal masses GU: No CVA tenderness.  Skin: No rashes, bruises or suspicious lesions. Lymph: No cervical or inguinal adenopathy. Neurologic: Grossly intact, no focal deficits, moving all 4 extremities. Psychiatric: Normal mood and affect.  Laboratory Data: Lab Results  Component Value Date   WBC 9.2 08/25/2016   HGB 14.9 08/25/2016   HCT 44.3 08/25/2016   MCV 94.9  08/25/2016   PLT 259.0 08/25/2016    Lab Results  Component Value Date   CREATININE 0.72 03/14/2017    Lab Results  Component Value Date   PSA 5.24 (H) 08/25/2016   PSA 4.19 (H) 12/08/2015    No results found for: TESTOSTERONE  Lab Results  Component Value Date   HGBA1C 6.7 (H) 03/14/2017    Urinalysis    Component Value Date/Time   COLORURINE YELLOW 03/14/2017 1631   APPEARANCEUR CLEAR 03/14/2017 1631   APPEARANCEUR Clear 11/28/2012 2258   LABSPEC 1.010 03/14/2017 1631   LABSPEC 1.036 11/28/2012 2258   PHURINE 7.0 03/14/2017 1631   GLUCOSEU NEGATIVE 03/14/2017 1631   HGBUR NEGATIVE 03/14/2017 1631   BILIRUBINUR NEGATIVE 03/14/2017 1631   BILIRUBINUR Negative 11/28/2012 2258  KETONESUR NEGATIVE 03/14/2017 1631   PROTEINUR Negative 11/28/2012 2258   UROBILINOGEN 0.2 03/14/2017 1631   NITRITE NEGATIVE 03/14/2017 1631   LEUKOCYTESUR NEGATIVE 03/14/2017 1631   LEUKOCYTESUR Negative 11/28/2012 2258    Assessment & Plan:    1.  Elevated PSA I discussed the patient that his PSA continues to rise despite 2 previous negative biopsies  With one being as recently as 6 months ago.  I discussed options with him which include repeating his PSA in a short interval, repeat prostate biopsy, and prostate MRI.  Prostate MRI may be his best solution, however he is being worked up for a piece of metal in his eye, so it is unclear if he is candidate for this.  He is adamantly against going through a prostate biopsy again at this time.  He therefore has elected to proceed with repeating his PSA in 3 months.  He hopefully will be cleared undergo an MRI at that time which we will plan for if his PSA rises again.  Could also consider a 4K score in the future.   Return in about 3 months (around 07/12/2017) for PSA prior.  Nickie Retort, MD  [facility] [address], [place], [place] [address] [phone]

## 2017-07-07 ENCOUNTER — Other Ambulatory Visit: Payer: 59

## 2017-07-10 ENCOUNTER — Other Ambulatory Visit: Payer: Self-pay | Admitting: Internal Medicine

## 2017-07-10 DIAGNOSIS — E119 Type 2 diabetes mellitus without complications: Secondary | ICD-10-CM

## 2017-07-10 MED ORDER — METFORMIN HCL 500 MG PO TABS: ORAL_TABLET | ORAL | 1 refills | Status: DC

## 2017-07-14 ENCOUNTER — Ambulatory Visit: Payer: 59

## 2017-07-21 ENCOUNTER — Other Ambulatory Visit: Payer: 59

## 2017-07-21 DIAGNOSIS — R972 Elevated prostate specific antigen [PSA]: Secondary | ICD-10-CM

## 2017-07-22 LAB — PSA: Prostate Specific Ag, Serum: 5.1 ng/mL — ABNORMAL HIGH (ref 0.0–4.0)

## 2017-08-04 ENCOUNTER — Ambulatory Visit: Payer: 59

## 2017-08-08 ENCOUNTER — Telehealth: Payer: Self-pay

## 2017-08-08 NOTE — Telephone Encounter (Signed)
lmom for pt

## 2017-08-08 NOTE — Telephone Encounter (Signed)
-----   Message from Abbie Sons, MD sent at 08/08/2017 12:45 PM EDT ----- PSA better at 5.1.  Keep scheduled follow-up

## 2017-08-09 NOTE — Telephone Encounter (Signed)
Patient notified of his PSA result.  He confirmed his follow up appt on 08/17/17.

## 2017-08-09 NOTE — Telephone Encounter (Signed)
lmom for pt

## 2017-08-15 ENCOUNTER — Encounter: Payer: Self-pay | Admitting: Internal Medicine

## 2017-08-15 ENCOUNTER — Ambulatory Visit: Payer: 59 | Admitting: Internal Medicine

## 2017-08-15 VITALS — BP 132/78 | HR 64 | Temp 98.8°F | Ht 71.5 in | Wt 245.6 lb

## 2017-08-15 DIAGNOSIS — Z85828 Personal history of other malignant neoplasm of skin: Secondary | ICD-10-CM | POA: Insufficient documentation

## 2017-08-15 DIAGNOSIS — Z1283 Encounter for screening for malignant neoplasm of skin: Secondary | ICD-10-CM

## 2017-08-15 DIAGNOSIS — Z6833 Body mass index (BMI) 33.0-33.9, adult: Secondary | ICD-10-CM | POA: Diagnosis not present

## 2017-08-15 DIAGNOSIS — I10 Essential (primary) hypertension: Secondary | ICD-10-CM | POA: Diagnosis not present

## 2017-08-15 DIAGNOSIS — G4733 Obstructive sleep apnea (adult) (pediatric): Secondary | ICD-10-CM

## 2017-08-15 DIAGNOSIS — I4891 Unspecified atrial fibrillation: Secondary | ICD-10-CM

## 2017-08-15 DIAGNOSIS — E6609 Other obesity due to excess calories: Secondary | ICD-10-CM

## 2017-08-15 DIAGNOSIS — L570 Actinic keratosis: Secondary | ICD-10-CM | POA: Diagnosis not present

## 2017-08-15 DIAGNOSIS — E119 Type 2 diabetes mellitus without complications: Secondary | ICD-10-CM

## 2017-08-15 DIAGNOSIS — R972 Elevated prostate specific antigen [PSA]: Secondary | ICD-10-CM | POA: Diagnosis not present

## 2017-08-15 MED ORDER — APIXABAN 5 MG PO TABS: 5.0000 mg | ORAL_TABLET | Freq: Two times a day (BID) | ORAL | 0 refills | Status: DC

## 2017-08-15 MED ORDER — ROSUVASTATIN CALCIUM 5 MG PO TABS: 5.0000 mg | ORAL_TABLET | Freq: Every day | ORAL | 3 refills | Status: DC

## 2017-08-15 MED ORDER — METFORMIN HCL 500 MG PO TABS: ORAL_TABLET | ORAL | 3 refills | Status: DC

## 2017-08-15 MED ORDER — ENALAPRIL MALEATE 5 MG PO TABS: 5.0000 mg | ORAL_TABLET | Freq: Every day | ORAL | 3 refills | Status: DC

## 2017-08-15 NOTE — Progress Notes (Signed)
Pre visit review using our clinic review tool, if applicable. No additional management support is needed unless otherwise documented below in the visit note. 

## 2017-08-15 NOTE — Patient Instructions (Addendum)
Please sch appt [facility] by 02/20/18  Also [place] appt due now  Ask urology about MRI of your prostate  Take  Schedule fasting labs w/in the next couple weeks  See my in 4- 32months   Diabetes Mellitus and Nutrition When you have diabetes (diabetes mellitus), it is very important to have healthy eating habits because your blood sugar (glucose) levels are greatly affected by what you eat and drink. Eating healthy foods in the appropriate amounts, at about the same times every day, can help you:  Control your blood glucose.  Lower your risk of heart disease.  Improve your blood pressure.  Reach or maintain a healthy weight.  Every person with diabetes is different, and each person has different needs for a meal plan. Your health care provider may recommend that you work with a diet and nutrition specialist (dietitian) to make a meal plan that is best for you. Your meal plan may vary depending on factors such as:  The calories you need.  The medicines you take.  Your weight.  Your blood glucose, blood pressure, and cholesterol levels.  Your activity level.  Other health conditions you have, such as heart or kidney disease.  How do carbohydrates affect me? Carbohydrates affect your blood glucose level more than any other type of food. Eating carbohydrates naturally increases the amount of glucose in your blood. Carbohydrate counting is a method for keeping track of how many carbohydrates you eat. Counting carbohydrates is important to keep your blood glucose at a healthy level, especially if you use insulin or take certain oral diabetes medicines. It is important to know how many carbohydrates you can safely have in each meal. This is different for every person. Your dietitian can help you calculate how many carbohydrates you should have at each meal and for snack. Foods that contain carbohydrates include:  Bread, cereal, rice, pasta, and crackers.  Potatoes and corn.  Peas,  beans, and lentils.  Milk and yogurt.  Fruit and juice.  Desserts, such as cakes, cookies, ice cream, and candy.  How does alcohol affect me? Alcohol can cause a sudden decrease in blood glucose (hypoglycemia), especially if you use insulin or take certain oral diabetes medicines. Hypoglycemia can be a life-threatening condition. Symptoms of hypoglycemia (sleepiness, dizziness, and confusion) are similar to symptoms of having too much alcohol. If your health care provider says that alcohol is safe for you, follow these guidelines:  Limit alcohol intake to no more than 1 drink per day for nonpregnant women and 2 drinks per day for men. One drink equals 12 oz of beer, 5 oz of wine, or 1 oz of hard liquor.  Do not drink on an empty stomach.  Keep yourself hydrated with water, diet soda, or unsweetened iced tea.  Keep in mind that regular soda, juice, and other mixers may contain a lot of sugar and must be counted as carbohydrates.  What are tips for following this plan? Reading food labels  Start by checking the serving size on the label. The amount of calories, carbohydrates, fats, and other nutrients listed on the label are based on one serving of the food. Many foods contain more than one serving per package.  Check the total grams (g) of carbohydrates in one serving. You can calculate the number of servings of carbohydrates in one serving by dividing the total carbohydrates by 15. For example, if a food has 30 g of total carbohydrates, it would be equal to 2 servings of carbohydrates.  Check the number of grams (g) of saturated and trans fats in one serving. Choose foods that have low or no amount of these fats.  Check the number of milligrams (mg) of sodium in one serving. Most people should limit total sodium intake to less than 2,300 mg per day.  Always check the nutrition information of foods labeled as "low-fat" or "nonfat". These foods may be higher in added sugar or refined  carbohydrates and should be avoided.  Talk to your dietitian to identify your daily goals for nutrients listed on the label. Shopping  Avoid buying canned, premade, or processed foods. These foods tend to be high in fat, sodium, and added sugar.  Shop around the outside edge of the grocery store. This includes fresh fruits and vegetables, bulk grains, fresh meats, and fresh dairy. Cooking  Use low-heat cooking methods, such as baking, instead of high-heat cooking methods like deep frying.  Cook using healthy oils, such as olive, canola, or sunflower oil.  Avoid cooking with butter, cream, or high-fat meats. Meal planning  Eat meals and snacks regularly, preferably at the same times every day. Avoid going long periods of time without eating.  Eat foods high in fiber, such as fresh fruits, vegetables, beans, and whole grains. Talk to your dietitian about how many servings of carbohydrates you can eat at each meal.  Eat 4-6 ounces of lean protein each day, such as lean meat, chicken, fish, eggs, or tofu. 1 ounce is equal to 1 ounce of meat, chicken, or fish, 1 egg, or 1/4 cup of tofu.  Eat some foods each day that contain healthy fats, such as avocado, nuts, seeds, and fish. Lifestyle   Check your blood glucose regularly.  Exercise at least 30 minutes 5 or more days each week, or as told by your health care provider.  Take medicines as told by your health care provider.  Do not use any products that contain nicotine or tobacco, such as cigarettes and e-cigarettes. If you need help quitting, ask your health care provider.  Work with a Social worker or diabetes educator to identify strategies to manage stress and any emotional and social challenges. What are some questions to ask my health care provider?  Do I need to meet with a diabetes educator?  Do I need to meet with a dietitian?  What number can I call if I have questions?  When are the best times to check my blood  glucose? Where to find more information:  American Diabetes Association: diabetes.org/food-and-fitness/food  Academy of Nutrition and Dietetics: PokerClues.dk  [employer] of Diabetes and Digestive and Kidney Diseases (NIH): ContactWire.be Summary  A healthy meal plan will help you control your blood glucose and maintain a healthy lifestyle.  Working with a diet and nutrition specialist (dietitian) can help you make a meal plan that is best for you.  Keep in mind that carbohydrates and alcohol have immediate effects on your blood glucose levels. It is important to count carbohydrates and to use alcohol carefully. This information is not intended to replace advice given to you by your health care provider. Make sure you discuss any questions you have with your health care provider. Document Released: 11/18/2004 Document Revised: 03/28/2016 Document Reviewed: 03/28/2016 Elsevier Interactive Patient Education  [employer].

## 2017-08-15 NOTE — Progress Notes (Signed)
Chief Complaint  Patient presents with  . Follow-up   F/u  1. HTN controlled on vasotec 5 mg qd 2. DM 2 cbgs at home 120-130s last A1C 6.7 on metformin 500 mg bid  3. Vision changes resolved since last visit and not had any further issues never got MRI brain due to metal objects in eyes and pt never had orbital Xrays done he wants to hold on further w/u for now  4. Elevated PSA 07/21/17 was 5.1 max >7.0 pt really does not want prostate biopsy and prefers imaging will disc with urology 08/17/17 FH prostate cancer in dad and prostate is enlarged PSA has been elevated from what I can see since 2017  5. OSA on cpap and helping due for repeat sleep study 12/26/17  6. Afib resolved per pt since using cpap he needs to sch appt with cardiology temp refilled eliquis today   Review of Systems  Constitutional: Negative for weight loss.  HENT: Negative for hearing loss.   Eyes: Negative for blurred vision.       Denies vision problems    Respiratory: Negative for shortness of breath.   Cardiovascular: Negative for chest pain.  Genitourinary:       +enlarged prostate   Musculoskeletal: Negative for falls.  Skin: Negative for rash.  Neurological: Negative for headaches.  Psychiatric/Behavioral: Negative for depression.   Past Medical History:  Diagnosis Date  . A-fib ([place])   . Cancer ([place])    skin  . Chicken pox   . Elevated PSA    follows [place] [facility]   . GERD (gastroesophageal reflux disease)   . Hyperlipidemia   . Hypertension   . Sleep apnea   . Type 2 diabetes mellitus ([place])    Past Surgical History:  Procedure Laterality Date  . APPENDECTOMY    . COLONOSCOPY WITH PROPOFOL N/A 08/17/2015   Procedure: COLONOSCOPY WITH PROPOFOL;  Surgeon: Lollie Sails, MD;  Location: [facility] ENDOSCOPY;  Service: Endoscopy;  Laterality: N/A;  . HERNIA REPAIR    . PROSTATE BIOPSY     10/2016 negative multiple biopsies 2015 per note atypical cells unable to see this path report   Family  History  Problem Relation Age of Onset  . Diabetes Mother   . Alcohol abuse Father   . Prostate cancer Father   . Cancer Father        prostate   . Diabetes Maternal Grandmother   . Bladder Cancer Neg Hx   . Kidney cancer Neg Hx    Social History   Socioeconomic History  . Marital status: Married    Spouse name: Not on file  . Number of children: Not on file  . Years of education: Not on file  . Highest education level: Not on file  Occupational History  . Not on file  Social Needs  . Financial resource strain: Not on file  . Food insecurity:    Worry: Not on file    Inability: Not on file  . Transportation needs:    Medical: Not on file    Non-medical: Not on file  Tobacco Use  . Smoking status: Never Smoker  . Smokeless tobacco: Never Used  Substance and Sexual Activity  . Alcohol use: Yes    Alcohol/week: 3.0 oz    Types: 5 Cans of beer per week    Comment: social drinker  . Drug use: No  . Sexual activity: Yes    Partners: Female  Lifestyle  . Physical activity:  Days per week: Not on file    Minutes per session: Not on file  . Stress: Not on file  Relationships  . Social connections:    Talks on phone: Not on file    Gets together: Not on file    Attends religious service: Not on file    Active member of club or organization: Not on file    Attends meetings of clubs or organizations: Not on file    Relationship status: Not on file  . Intimate partner violence:    Fear of current or ex partner: Not on file    Emotionally abused: Not on file    Physically abused: Not on file    Forced sexual activity: Not on file  Other Topics Concern  . Not on file  Social History Narrative   Married    Works in [employer]; never smoker    Current Meds  Medication Sig  . ACCU-CHEK AVIVA PLUS test strip USE TWICE DAILY AS DIRECTED  . apixaban (ELIQUIS) 5 MG TABS tablet Take 1 tablet (5 mg total) by mouth 2 (two) times daily.  . Ascorbic Acid (VITAMIN C)  1000 MG tablet Take 1,000 mg by mouth daily.  Marland Kitchen CIALIS 5 MG tablet TAKE 1 TABLET BY MOUTH EVERY DAY AS NEEDED FOR ERECTILE DYSFUNCTION  . CINNAMON PO Take by mouth 3 (three) times daily.  . enalapril (VASOTEC) 5 MG tablet Take 1 tablet (5 mg total) by mouth daily.  Marland Kitchen MAGNESIUM PO Take by mouth.  . metFORMIN (GLUCOPHAGE) 500 MG tablet TAKE 1 TABLET BY MOUTH TWICE DAILY WITH A MEAL  . Multiple Vitamins-Minerals (MEGA BASIC PO) Take by mouth.  . nebivolol (BYSTOLIC) 5 MG tablet Take 1 tablet (5 mg total) by mouth daily.  . Omega-3 Fatty Acids (FISH OIL PO) Take by mouth daily.  . ranitidine (ZANTAC) 75 MG tablet Take 75 mg by mouth 2 (two) times daily.  . rosuvastatin (CRESTOR) 5 MG tablet Take 1 tablet (5 mg total) by mouth daily.   Allergies  Allergen Reactions  . Penicillin G Anaphylaxis  . Penicillins    Recent Results (from the past 2160 hour(s))  PSA     Status: Abnormal   Collection Time: 07/21/17  4:20 PM  Result Value Ref Range   Prostate Specific Ag, Serum 5.1 (H) 0.0 - 4.0 ng/mL    Comment: Roche ECLIA methodology. According to the American Urological Association, Serum PSA should decrease and remain at undetectable levels after radical prostatectomy. The AUA defines biochemical recurrence as an initial PSA value 0.2 ng/mL or greater followed by a subsequent confirmatory PSA value 0.2 ng/mL or greater. Values obtained with different assay methods or kits cannot be used interchangeably. Results cannot be interpreted as absolute evidence of the presence or absence of malignant disease.    Objective  Body mass index is 33.78 kg/m. Wt Readings from Last 3 Encounters:  08/15/17 245 lb 9.6 oz (111.4 kg)  04/14/17 244 lb 6.4 oz (110.9 kg)  03/14/17 242 lb 2 oz (109.8 kg)   Temp Readings from Last 3 Encounters:  08/15/17 98.8 F (37.1 C) (Oral)  03/14/17 98.5 F (36.9 C) (Oral)  08/25/16 99 F (37.2 C) (Oral)   BP Readings from Last 3 Encounters:  08/15/17 132/78   04/14/17 (!) 144/85  03/14/17 118/78   Pulse Readings from Last 3 Encounters:  08/15/17 64  04/14/17 76  03/14/17 77    Physical Exam  Constitutional: He is oriented to person, place,  and time. Vital signs are normal. He appears well-developed and well-nourished. He is cooperative.  HENT:  Head: Normocephalic and atraumatic.  Mouth/Throat: Oropharynx is clear and moist and mucous membranes are normal.  Eyes: Pupils are equal, round, and reactive to light. Conjunctivae are normal.  Cardiovascular: Normal rate, regular rhythm and normal heart sounds.  NSR today   Pulmonary/Chest: Effort normal and breath sounds normal.  Neurological: He is alert and oriented to person, place, and time. Gait normal.  Skin: Skin is warm and dry.  Aks to face   Psychiatric: He has a normal mood and affect. His speech is normal and behavior is normal. Judgment and thought content normal. Cognition and memory are normal.  Nursing note and vitals reviewed.   Assessment   1. HTN 2. DM 2 last A1C 6.7  3. Elevated PSA 5.1 07/21/17  4. OSA on cpap  5. Afib history now in NSR 6. 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 HM Plan   1. Refilled vasotec 5 mg qd  Check labs upcoming  2.  Check labs upcoming on metformin 500 mg bid  Normal foot exam 03/14/17  Urine had 03/2017  [place] eye 02/20/17 neg due again in 1 year  Check labs upcoming  3. Upcoming f/u urology  Consider MRI prostate in future pt really does not want bx  4. Feeling great last sleep study 12/26/12 due for repeat 12/26/17  Benefiting from cpap has nasal mask 5. rec sch appt with Dr. Princella Ion refill eliquis today  6. Referred to Dr. Kellie Moor needs tbse h/o BCC face  7.  Declines flu, pna 23, had Tdap 12/08/15  Declined MMR testing Declined hep B/C/HIV testing  Colonoscopy had 08/17/15 multiple polyps [facility] tubular and hyperplastic would repeat in 5 years    Provider: Dr. Olivia Mackie McLean-Scocuzza-Internal Medicine

## 2017-08-17 ENCOUNTER — Ambulatory Visit (INDEPENDENT_AMBULATORY_CARE_PROVIDER_SITE_OTHER): Payer: 59 | Admitting: Urology

## 2017-08-17 ENCOUNTER — Encounter: Payer: Self-pay | Admitting: Urology

## 2017-08-17 VITALS — BP 123/80 | HR 77 | Ht 72.0 in | Wt 245.0 lb

## 2017-08-17 DIAGNOSIS — R972 Elevated prostate specific antigen [PSA]: Secondary | ICD-10-CM | POA: Diagnosis not present

## 2017-08-17 LAB — URINALYSIS, COMPLETE
BILIRUBIN UA: NEGATIVE
Glucose, UA: NEGATIVE
Ketones, UA: NEGATIVE
Leukocytes, UA: NEGATIVE
NITRITE UA: NEGATIVE
PH UA: 7 (ref 5.0–7.5)
Protein, UA: NEGATIVE
RBC UA: NEGATIVE
Specific Gravity, UA: 1.015 (ref 1.005–1.030)
Urobilinogen, Ur: 0.2 mg/dL (ref 0.2–1.0)

## 2017-08-17 LAB — MICROSCOPIC EXAMINATION
Bacteria, UA: NONE SEEN
EPITHELIAL CELLS (NON RENAL): NONE SEEN /HPF (ref 0–10)

## 2017-08-17 NOTE — Progress Notes (Signed)
08/17/2017 3:27 PM   [place] September 19, 1955 [id number]  Referring provider: McLean-Scocuzza, Nino Glow, MD [place], [place] [address]  Chief Complaint  Patient presents with  . Elevated PSA    49month    HPI: F/u --   1. Elevated PSA His PSA was 5.24 in June 2018.He underwent repeat biopsy 10/06/2016 which was benign with a 55 gram prostate. PSAwas 4.19 in October 2017. Patient does have history of previous prostate biopsy for a PSA of 3.8 in 2015. There were atypical cells noted at the right apex. Repeat biopsy was recommended that the patient eventually underwent which was negative in August 2018. He does have a strong family history of prostate cancer in father who died from prostate cancer at the age of 8. PSA was 7.09 Mar 2017 and they considered MRI but pt may have a piece of metal in his eye. PSA returned to baseline May 2019 at 5.1.    2. BPH Previous IPSS: 6/2. Minor complaints in every urinary category except urgency. Nocturia x1. Minimal bother from urinary symptoms.Prostate 55 g on imaging.  3. Left hydrocele Patient with known complex left hydrocele diagnosed on exam and confirmed with scrotal ultrasound 2015. He is currently not bothered by this.  He returns and PSA remains stable at 5.1. He has occasional hesitancy. Most of the time good.   PMH: Past Medical History:  Diagnosis Date  . A-fib ([place])   . Cancer ([place])    skin bcc face s/p mohs [facility]  . Chicken pox   . Elevated PSA    follows [place] [facility]   . GERD (gastroesophageal reflux disease)   . Hyperlipidemia   . Hypertension   . Sleep apnea   . Type 2 diabetes mellitus [facility])     Surgical History: Past Surgical History:  Procedure Laterality Date  . APPENDECTOMY    . COLONOSCOPY WITH PROPOFOL N/A 08/17/2015   Procedure: COLONOSCOPY WITH PROPOFOL;  Surgeon: Lollie Sails, MD;  Location: [facility] ENDOSCOPY;  Service: Endoscopy;  Laterality: N/A;  . HERNIA REPAIR    . PROSTATE  BIOPSY     10/2016 negative multiple biopsies 2015 per note atypical cells unable to see this path report    Home Medications:  Allergies as of 08/17/2017      Reactions   Penicillin G Anaphylaxis   Penicillins       Medication List        Accurate as of 08/17/17  3:27 PM. Always use your most recent med list.          ACCU-CHEK AVIVA PLUS test strip Generic drug:  glucose blood USE TWICE DAILY AS DIRECTED   apixaban 5 MG Tabs tablet Commonly known as:  ELIQUIS Take 1 tablet (5 mg total) by mouth 2 (two) times daily.   CIALIS 5 MG tablet Generic drug:  tadalafil TAKE 1 TABLET BY MOUTH EVERY DAY AS NEEDED FOR ERECTILE DYSFUNCTION   CINNAMON PO Take by mouth 3 (three) times daily.   enalapril 5 MG tablet Commonly known as:  VASOTEC Take 1 tablet (5 mg total) by mouth daily.   FISH OIL PO Take by mouth daily.   MAGNESIUM PO Take by mouth.   MEGA BASIC PO Take by mouth.   metFORMIN 500 MG tablet Commonly known as:  GLUCOPHAGE TAKE 1 TABLET BY MOUTH TWICE DAILY WITH A MEAL   nebivolol 5 MG tablet Commonly known as:  BYSTOLIC Take 1 tablet (5 mg total) by mouth daily.  ranitidine 75 MG tablet Commonly known as:  ZANTAC Take 75 mg by mouth 2 (two) times daily.   rosuvastatin 5 MG tablet Commonly known as:  CRESTOR Take 1 tablet (5 mg total) by mouth daily. At night   vitamin C 1000 MG tablet Take 1,000 mg by mouth daily.       Allergies:  Allergies  Allergen Reactions  . Penicillin G Anaphylaxis  . Penicillins     Family History: Family History  Problem Relation Age of Onset  . Diabetes Mother   . Cancer Mother        ovarian   . Alcohol abuse Father   . Prostate cancer Father   . Cancer Father        prostate   . Diabetes Maternal Grandmother   . Bladder Cancer Neg Hx   . Kidney cancer Neg Hx     Social History:  reports that he has never smoked. He has never used smokeless tobacco. He reports that he drinks about 3.0 oz of alcohol  per week. He reports that he does not use drugs.  ROS:                                        Physical Exam: There were no vitals taken for this visit.  Constitutional:  Alert and oriented, No acute distress. HEENT: [place] AT, moist mucus membranes.  Trachea midline, no masses. Cardiovascular: No clubbing, cyanosis, or edema. Respiratory: Normal respiratory effort, no increased work of breathing. GI: Abdomen is soft, nontender, nondistended, no abdominal masses GU: No CVA tenderness DRE: 50 g , benign. No hard area or nodule.  Lymph: No cervical or inguinal lymphadenopathy. Skin: No rashes, bruises or suspicious lesions. Neurologic: Grossly intact, no focal deficits, moving all 4 extremities. Psychiatric: Normal mood and affect.  Laboratory Data: Lab Results  Component Value Date   WBC 9.2 08/25/2016   HGB 14.9 08/25/2016   HCT 44.3 08/25/2016   MCV 94.9 08/25/2016   PLT 259.0 08/25/2016    Lab Results  Component Value Date   CREATININE 0.72 03/14/2017    Lab Results  Component Value Date   PSA 5.24 (H) 08/25/2016   PSA 4.19 (H) 12/08/2015    No results found for: TESTOSTERONE  Lab Results  Component Value Date   HGBA1C 6.7 (H) 03/14/2017    Urinalysis    Component Value Date/Time   COLORURINE YELLOW 03/14/2017 1631   APPEARANCEUR CLEAR 03/14/2017 1631   APPEARANCEUR Clear 11/28/2012 2258   LABSPEC 1.010 03/14/2017 1631   LABSPEC 1.036 11/28/2012 2258   PHURINE 7.0 03/14/2017 1631   GLUCOSEU NEGATIVE 03/14/2017 1631   HGBUR NEGATIVE 03/14/2017 1631   BILIRUBINUR NEGATIVE 03/14/2017 1631   BILIRUBINUR Negative 11/28/2012 2258   KETONESUR NEGATIVE 03/14/2017 1631   PROTEINUR Negative 11/28/2012 2258   UROBILINOGEN 0.2 03/14/2017 1631   NITRITE NEGATIVE 03/14/2017 1631   LEUKOCYTESUR NEGATIVE 03/14/2017 1631   LEUKOCYTESUR Negative 11/28/2012 2258    Lab Results  Component Value Date   BACTERIA NONE SEEN 11/28/2012   No results  found for this or any previous visit. No results found for this or any previous visit. No results found for this or any previous visit. No results found for this or any previous visit. No results found for this or any previous visit. No results found for this or any previous visit. No results found for this  or any previous visit. No results found for this or any previous visit.  Assessment & Plan:    1. Elevated PSA Discussed the nature r/b of continued surveillance, MRI, other labs and repeat biopsy. He want to proceed with MRI and I'll let Dr. Terese Door know to check the xray of his left eye.   - Urinalysis, Complete   No follow-ups on file.  Festus Aloe, MD  [facility] [address] [place], [place] [address] [phone]

## 2017-08-18 ENCOUNTER — Telehealth: Payer: Self-pay | Admitting: Internal Medicine

## 2017-08-18 ENCOUNTER — Other Ambulatory Visit: Payer: Self-pay | Admitting: Internal Medicine

## 2017-08-18 DIAGNOSIS — T1590XD Foreign body on external eye, part unspecified, unspecified eye, subsequent encounter: Secondary | ICD-10-CM

## 2017-08-18 NOTE — Telephone Encounter (Signed)
Please set up orbital Xray of b/l eyes due to h/o ? Metal in eyes and pt needs MRI of prostate  Inform patient I will order Xray eyes and urology MRI prostate   Thanks [place]

## 2017-08-21 NOTE — Telephone Encounter (Signed)
Called patient to make him aware that he can walk into [facility] for xray any time.

## 2017-08-24 ENCOUNTER — Other Ambulatory Visit (INDEPENDENT_AMBULATORY_CARE_PROVIDER_SITE_OTHER): Payer: 59

## 2017-08-24 DIAGNOSIS — I10 Essential (primary) hypertension: Secondary | ICD-10-CM

## 2017-08-24 DIAGNOSIS — E119 Type 2 diabetes mellitus without complications: Secondary | ICD-10-CM

## 2017-08-24 LAB — COMPREHENSIVE METABOLIC PANEL
ALBUMIN: 4.6 g/dL (ref 3.5–5.2)
ALK PHOS: 52 U/L (ref 39–117)
ALT: 30 U/L (ref 0–53)
AST: 20 U/L (ref 0–37)
BILIRUBIN TOTAL: 1.2 mg/dL (ref 0.2–1.2)
BUN: 14 mg/dL (ref 6–23)
CO2: 28 mEq/L (ref 19–32)
CREATININE: 0.74 mg/dL (ref 0.40–1.50)
Calcium: 9.7 mg/dL (ref 8.4–10.5)
Chloride: 102 mEq/L (ref 96–112)
GFR: 114.65 mL/min (ref >60.00)
GLUCOSE: 137 mg/dL — AB (ref 70–99)
Potassium: 4.6 mEq/L (ref 3.5–5.1)
Sodium: 139 mEq/L (ref 135–145)
TOTAL PROTEIN: 7.1 g/dL (ref 6.0–8.3)

## 2017-08-24 LAB — HEMOGLOBIN A1C: Hgb A1c MFr Bld: 6.9 % — ABNORMAL HIGH (ref 4.6–6.5)

## 2017-08-24 LAB — CBC WITH DIFFERENTIAL/PLATELET
BASOS PCT: 0.5 % (ref 0.0–3.0)
Basophils Absolute: 0 10*3/uL (ref 0.0–0.1)
EOS ABS: 0.2 10*3/uL (ref 0.0–0.7)
Eosinophils Relative: 2 % (ref 0.0–5.0)
HEMATOCRIT: 44.5 % (ref 39.0–52.0)
Hemoglobin: 15.2 g/dL (ref 13.0–17.0)
LYMPHS ABS: 2.5 10*3/uL (ref 0.7–4.0)
LYMPHS PCT: 28.9 % (ref 12.0–46.0)
MCHC: 34.1 g/dL (ref 30.0–36.0)
MCV: 95.8 fl (ref 78.0–100.0)
Monocytes Absolute: 0.6 10*3/uL (ref 0.1–1.0)
Monocytes Relative: 7 % (ref 3.0–12.0)
NEUTROS PCT: 61.6 % (ref 43.0–77.0)
Neutro Abs: 5.4 10*3/uL (ref 1.4–7.7)
Platelets: 250 10*3/uL (ref 150.0–400.0)
RBC: 4.65 Mil/uL (ref 4.22–5.81)
RDW: 14.3 % (ref 11.5–15.5)
WBC: 8.7 10*3/uL (ref 4.0–10.5)

## 2017-08-24 LAB — LIPID PANEL
CHOLESTEROL: 140 mg/dL (ref 0–200)
HDL: 47.8 mg/dL (ref >39.00)
LDL CALC: 82 mg/dL (ref 0–99)
NonHDL: 91.71
TRIGLYCERIDES: 50 mg/dL (ref 0.0–149.0)
Total CHOL/HDL Ratio: 3
VLDL: 10 mg/dL (ref 0.0–40.0)

## 2017-08-25 ENCOUNTER — Other Ambulatory Visit: Payer: 59

## 2017-09-01 ENCOUNTER — Telehealth: Payer: Self-pay

## 2017-09-01 NOTE — Telephone Encounter (Signed)
Copied from [place] [id number]. Topic: General - Other >> Sep 01, 2017 11:21 AM Yvette Rack wrote: Reason for CRM: [place] from [facility] calling stating that they have an order for pt to have a PSG and that the pt is already on a CPAP machine and would need authorization from insurance to go test

## 2017-09-04 NOTE — Telephone Encounter (Signed)
Please advise 

## 2017-09-06 ENCOUNTER — Encounter: Payer: Self-pay | Admitting: *Deleted

## 2017-09-14 ENCOUNTER — Ambulatory Visit
Admission: RE | Admit: 2017-09-14 | Discharge: 2017-09-14 | Disposition: A | Discharge status: Home / Self Care | Payer: 59 | Source: Ambulatory Visit | Attending: Internal Medicine | Admitting: Internal Medicine

## 2017-09-14 DIAGNOSIS — T1590XD Foreign body on external eye, part unspecified, unspecified eye, subsequent encounter: Secondary | ICD-10-CM

## 2017-09-14 DIAGNOSIS — Z0100 Encounter for examination of eyes and vision without abnormal findings: Secondary | ICD-10-CM | POA: Insufficient documentation

## 2017-09-14 DIAGNOSIS — X58XXXD Exposure to other specified factors, subsequent encounter: Secondary | ICD-10-CM | POA: Diagnosis not present

## 2017-09-14 DIAGNOSIS — T1592XD Foreign body on external eye, part unspecified, left eye, subsequent encounter: Secondary | ICD-10-CM | POA: Insufficient documentation

## 2017-09-14 DIAGNOSIS — Z135 Encounter for screening for eye and ear disorders: Secondary | ICD-10-CM | POA: Diagnosis not present

## 2017-09-17 ENCOUNTER — Other Ambulatory Visit: Payer: Self-pay | Admitting: Cardiovascular Disease

## 2017-09-27 ENCOUNTER — Telehealth: Payer: Self-pay

## 2017-09-27 NOTE — Telephone Encounter (Signed)
Patient notified and apt previously scheduled

## 2017-09-27 NOTE — Telephone Encounter (Signed)
-----   Message from Festus Aloe, MD sent at 09/27/2017  3:19 PM EDT ----- Notify patient he should NOT get a MRI prostate because he does have a metallic foreign body in his left cheek near the left eye. In his case, because his PSA was stable I recommend we see him back in 5 months with a PSA prior.   Can you make a note on the chart "patient should not get a MRI prostate. He has a metallic foreign body in his left cheek near the left eye."   Thanks!  ----- Message ----- From: Abigail Miyamoto, MD Sent: 09/26/2017   8:38 AM To: Festus Aloe, MD  Shouldn't be a problem unless near eye.  Sorry...just saw this.  Don't see epic email very often.   ----- Message ----- From: Festus Aloe, MD Sent: 09/18/2017   5:07 PM To: Abigail Miyamoto, MD  Marylyn Ishihara,   See Xray. Pt has a small metallic object in cheek area. Could he get a prostate MRI?  Thanks,  Matt    ----- Message ----- From: McLean-Scocuzza, Nino Glow, MD Sent: 09/15/2017  12:27 PM To: Festus Aloe, MD, Babs Bertin, CMA  Small metallic foreign body in left cheek area  Dr. Junious Silk will you still ask radiology about prostate MRI ?  Fransisco Beau inform pt    [place]

## 2017-10-13 DIAGNOSIS — D485 Neoplasm of uncertain behavior of skin: Secondary | ICD-10-CM | POA: Diagnosis not present

## 2017-10-13 DIAGNOSIS — X32XXXA Exposure to sunlight, initial encounter: Secondary | ICD-10-CM | POA: Diagnosis not present

## 2017-10-13 DIAGNOSIS — D2262 Melanocytic nevi of left upper limb, including shoulder: Secondary | ICD-10-CM | POA: Diagnosis not present

## 2017-10-13 DIAGNOSIS — C44519 Basal cell carcinoma of skin of other part of trunk: Secondary | ICD-10-CM | POA: Diagnosis not present

## 2017-10-13 DIAGNOSIS — C44612 Basal cell carcinoma of skin of right upper limb, including shoulder: Secondary | ICD-10-CM | POA: Diagnosis not present

## 2017-10-13 DIAGNOSIS — D2271 Melanocytic nevi of right lower limb, including hip: Secondary | ICD-10-CM | POA: Diagnosis not present

## 2017-10-13 DIAGNOSIS — D2261 Melanocytic nevi of right upper limb, including shoulder: Secondary | ICD-10-CM | POA: Diagnosis not present

## 2017-10-13 DIAGNOSIS — L570 Actinic keratosis: Secondary | ICD-10-CM | POA: Diagnosis not present

## 2017-10-25 ENCOUNTER — Other Ambulatory Visit: Payer: Self-pay

## 2017-10-25 DIAGNOSIS — I4891 Unspecified atrial fibrillation: Secondary | ICD-10-CM

## 2017-10-25 MED ORDER — APIXABAN 5 MG PO TABS: 5.0000 mg | ORAL_TABLET | Freq: Two times a day (BID) | ORAL | 0 refills | Status: DC

## 2017-11-01 ENCOUNTER — Other Ambulatory Visit: Payer: Self-pay | Admitting: Internal Medicine

## 2017-11-01 DIAGNOSIS — E119 Type 2 diabetes mellitus without complications: Secondary | ICD-10-CM

## 2017-11-01 MED ORDER — GLUCOSE BLOOD VI STRP: ORAL_STRIP | 3 refills | Status: AC

## 2017-11-03 ENCOUNTER — Ambulatory Visit: Payer: 59 | Admitting: Cardiovascular Disease

## 2017-11-13 NOTE — Progress Notes (Signed)
Patient ID: Brandon [name], male   DOB: 07/24/1955, 62 y.o.   MRN: [id number] Cardiology Office Note  Date:  11/14/2017   ID:  Brandon [name], DOB 1955-08-19, MRN [id number]  PCP:  [name], Brandon Glow, MD   Chief Complaint  Patient presents with  . OTHER    OD 12 month f/u. Meds reviewed verbally with pt.    HPI:  [name] is a very pleasant 62 year old gentleman with a history of  atrial fibrillation,  sleep apnea, wears CPAP  diabetes type 2 with hemoglobin A1c 10.8 in September 2014,  hemoglobin A1c recently 6.5 History of alcohol presents for followup of his atrial fibrillation  Overall reports feeling well with no complaints Denies any significant palpitations, does not think he is having any atrial fibrillation Problems with erectile dysfunction.  Cialis does not seem to work Blood pressure running low.  Denies significant orthostasis symptoms  Denies any chest pain concerning for angina No regular exercise program Thinking of retiring and moving [place]  EKG personally reviewed by myself on todays visit Shows normal sinus rhythm rate 65 bpm no significant ST or T wave changes  Other past medical history reviewed hospitalization 11/29/2012. History of alcohol  Magnesium was low on arrival. He was started on metoprolol tartrate, anticoagulation with eliquis. Also diagnosed with diabetes type 2 with hemoglobin A1c 10.8. He converted to normal sinus rhythm during his hospital course  Echocardiogram showed ejection fraction 60-65%, normal RV function, mild MR, moderately elevated right ventricular systolic pressures. Liver enzymes were mildly elevated. Glucose 236  PMH:   has a past medical history of A-fib ([place]), Cancer ([place]), Chicken pox, Elevated PSA, GERD (gastroesophageal reflux disease), Hyperlipidemia, Hypertension, Sleep apnea, and Type 2 diabetes mellitus ([place]).  PSH:    Past Surgical History:  Procedure Laterality Date  . APPENDECTOMY    . COLONOSCOPY WITH  PROPOFOL N/A 08/17/2015   Procedure: COLONOSCOPY WITH PROPOFOL;  Surgeon: Lollie Sails, MD;  Location: [facility] ENDOSCOPY;  Service: Endoscopy;  Laterality: N/A;  . HERNIA REPAIR    . PROSTATE BIOPSY     10/2016 negative multiple biopsies 2015 per note atypical cells unable to see this path report    Current Outpatient Medications  Medication Sig Dispense Refill  . apixaban (ELIQUIS) 5 MG TABS tablet Take 1 tablet (5 mg total) by mouth 2 (two) times daily. 60 tablet 0  . Ascorbic Acid (VITAMIN C) 1000 MG tablet Take 1,000 mg by mouth daily.    Marland Kitchen BYSTOLIC 5 MG tablet TAKE 1 TABLET BY MOUTH DAILY 90 tablet 0  . CIALIS 5 MG tablet TAKE 1 TABLET BY MOUTH EVERY DAY AS NEEDED FOR ERECTILE DYSFUNCTION 30 tablet 0  . CINNAMON PO Take by mouth 3 (three) times daily.    . enalapril (VASOTEC) 5 MG tablet Take 1 tablet (5 mg total) by mouth daily. 90 tablet 3  . glucose blood (ACCU-CHEK AVIVA PLUS) test strip USE TWICE DAILY AS DIRECTED E 11.9 180 each 3  . MAGNESIUM PO Take by mouth.    . metFORMIN (GLUCOPHAGE) 500 MG tablet TAKE 1 TABLET BY MOUTH TWICE DAILY WITH A MEAL 180 tablet 3  . Multiple Vitamins-Minerals (MEGA BASIC PO) Take by mouth.    . Omega-3 Fatty Acids (FISH OIL PO) Take by mouth daily.    . ranitidine (ZANTAC) 75 MG tablet Take 75 mg by mouth 2 (two) times daily.    . rosuvastatin (CRESTOR) 5 MG tablet Take 1 tablet (5 mg total)  by mouth daily. At night 90 tablet 3   No current facility-administered medications for this visit.      Allergies:   Penicillin g and Penicillins   Social History:  The patient  reports that he has never smoked. He has never used smokeless tobacco. He reports that he drinks about 5.0 standard drinks of alcohol per week. He reports that he does not use drugs.   Family History:   family history includes Alcohol abuse in his father; Cancer in his father and mother; Diabetes in his maternal grandmother and mother; Prostate cancer in his father.    Review  of Systems: Review of Systems  Constitutional: Negative.   Respiratory: Negative.   Cardiovascular: Negative.   Gastrointestinal: Negative.   Musculoskeletal: Negative.   Neurological: Negative.   Psychiatric/Behavioral: Negative.   All other systems reviewed and are negative.    PHYSICAL EXAM: VS:  BP 105/76 (BP Location: Left Arm, Patient Position: Sitting, Cuff Size: Large)   Pulse 65   Ht 6' (1.829 m)   Wt 245 lb (111.1 kg)   BMI 33.23 kg/m  , BMI Body mass index is 33.23 kg/m. Constitutional:  oriented to person, place, and time. No distress.  HENT:  Head: Normocephalic and atraumatic.  Eyes:  no discharge. No scleral icterus.  Neck: Normal range of motion. Neck supple. No JVD present.  Cardiovascular: Normal rate, regular rhythm, normal heart sounds and intact distal pulses. Exam reveals no gallop and no friction rub. No edema No murmur heard. Pulmonary/Chest: Effort normal and breath sounds normal. No stridor. No respiratory distress.  no wheezes.  no rales.  no tenderness.  Abdominal: Soft.  no distension.  no tenderness.  Musculoskeletal: Normal range of motion.  no  tenderness or deformity.  Neurological:  normal muscle tone. Coordination normal. No atrophy Skin: Skin is warm and dry. No rash noted. not diaphoretic.  Psychiatric:  normal mood and affect. behavior is normal. Thought content normal.   Recent Labs: 03/14/2017: TSH 1.52 08/24/2017: ALT 30; BUN 14; Creatinine, Ser 0.74; Hemoglobin 15.2; Platelets 250.0; Potassium 4.6; Sodium 139    Lipid Panel Lab Results  Component Value Date   CHOL 140 08/24/2017   HDL 47.80 08/24/2017   LDLCALC 82 08/24/2017   TRIG 50.0 08/24/2017      Wt Readings from Last 3 Encounters:  11/14/17 245 lb (111.1 kg)  08/17/17 245 lb (111.1 kg)  08/15/17 245 lb 9.6 oz (111.4 kg)       ASSESSMENT AND PLAN:  Atrial fibrillation, unspecified type ([place]) - Plan: EKG 12-Lead Maintaining normal sinus rhythm, Tolerating  bystolic 5 mg daily and anticoagulation Had side effects previously on metoprolol  No changes to his medications  Benign essential HTN Reports blood pressure is running low, concerned Suggested he consider decreasing enalapril in 1/2, 2.5 mg daily Monitor blood pressure.  Dehydrated  Type 2 diabetes mellitus with complication, without long-term current use of insulin (HCC) Hemoglobin A1c improved  We have encouraged continued exercise, careful diet management in an effort to lose weight.  Sleep apnea Compliant with his CPAP Recommended weight loss  Hyperlipidemia Cholesterol is at goal on the current lipid regimen. No changes to the medications were made. Numbers at goal  Erectile dysfunction Prescription provided for generic Viagra He does have follow-up with urology.  Recommended he discuss this with them as well   Total encounter time more than 25 minutes  Greater than 50% was spent in counseling and coordination of care with the patient  Disposition:   F/U  12 months   Orders Placed This Encounter  Procedures  . EKG 12-Lead     Signed, Esmond Plants, M.D., Ph.D. 11/14/2017  [place], [place]

## 2017-11-14 ENCOUNTER — Ambulatory Visit: Payer: 59 | Admitting: Cardiovascular Disease

## 2017-11-14 ENCOUNTER — Encounter: Payer: Self-pay | Admitting: Cardiovascular Disease

## 2017-11-14 VITALS — BP 105/76 | HR 65 | Ht 72.0 in | Wt 245.0 lb

## 2017-11-14 DIAGNOSIS — E1169 Type 2 diabetes mellitus with other specified complication: Secondary | ICD-10-CM

## 2017-11-14 DIAGNOSIS — I4891 Unspecified atrial fibrillation: Secondary | ICD-10-CM | POA: Diagnosis not present

## 2017-11-14 DIAGNOSIS — I10 Essential (primary) hypertension: Secondary | ICD-10-CM

## 2017-11-14 DIAGNOSIS — N521 Erectile dysfunction due to diseases classified elsewhere: Secondary | ICD-10-CM

## 2017-11-14 DIAGNOSIS — E119 Type 2 diabetes mellitus without complications: Secondary | ICD-10-CM

## 2017-11-14 DIAGNOSIS — I480 Paroxysmal atrial fibrillation: Secondary | ICD-10-CM | POA: Diagnosis not present

## 2017-11-14 DIAGNOSIS — E785 Hyperlipidemia, unspecified: Secondary | ICD-10-CM | POA: Diagnosis not present

## 2017-11-14 DIAGNOSIS — G4733 Obstructive sleep apnea (adult) (pediatric): Secondary | ICD-10-CM

## 2017-11-14 MED ORDER — NEBIVOLOL HCL 5 MG PO TABS: 5.0000 mg | ORAL_TABLET | Freq: Every day | ORAL | 3 refills | Status: DC

## 2017-11-14 MED ORDER — ROSUVASTATIN CALCIUM 5 MG PO TABS: 5.0000 mg | ORAL_TABLET | Freq: Every day | ORAL | 3 refills | Status: DC

## 2017-11-14 MED ORDER — ENALAPRIL MALEATE 5 MG PO TABS: 5.0000 mg | ORAL_TABLET | Freq: Every day | ORAL | 3 refills | Status: DC

## 2017-11-14 MED ORDER — SILDENAFIL CITRATE 20 MG PO TABS: 20.0000 mg | ORAL_TABLET | Freq: Three times a day (TID) | ORAL | 3 refills | Status: DC | PRN

## 2017-11-14 MED ORDER — APIXABAN 5 MG PO TABS: 5.0000 mg | ORAL_TABLET | Freq: Two times a day (BID) | ORAL | 3 refills | Status: DC

## 2017-11-14 NOTE — Patient Instructions (Addendum)
Medication Instructions:   OK to cut the enalapril in 1/2 daily  Labwork:  No new labs needed  Testing/Procedures:  No further testing at this time   Follow-Up: It was a pleasure seeing you in the office today. Please call us if you have new issues that need to be addressed before your next appt.  [phone]  Your physician wants you to follow-up in: 12 months.  You will receive a reminder letter in the mail two months in advance. If you don't receive a letter, please call our office to schedule the follow-up appointment.  If you need a refill on your cardiac medications before your next appointment, please call your pharmacy.  For educational health videos Log in to : [email or web address] Or : [email or web address], password : [email or web address]

## 2017-11-20 DIAGNOSIS — C44612 Basal cell carcinoma of skin of right upper limb, including shoulder: Secondary | ICD-10-CM | POA: Diagnosis not present

## 2017-12-04 DIAGNOSIS — C44519 Basal cell carcinoma of skin of other part of trunk: Secondary | ICD-10-CM | POA: Diagnosis not present

## 2018-02-13 ENCOUNTER — Telehealth: Payer: Self-pay | Admitting: Urology

## 2018-02-13 NOTE — Telephone Encounter (Signed)
Please review and advise.

## 2018-02-13 NOTE — Telephone Encounter (Signed)
It looks like Eskridge wanted a prostate MRI, so he should have a prostate MRI and PSA done prior. I went ahead and ordered the MRI, it doesn't look like he has had one done.   Nickolas Madrid, MD 02/13/2018

## 2018-02-13 NOTE — Telephone Encounter (Signed)
Pt will be seeing Sninsky for the 1st time in 1/20.  He saw Eskridge in June.  He wants to know if he will need to come in and have PSA drawn prior.  Please let pt know.

## 2018-02-14 NOTE — Telephone Encounter (Signed)
Patient is not able to have MRI, he has metal in his eye. Please review phone note 09/27/17. Eskridge was just watching PSA level every 6 months. He has had several Biopsies.

## 2018-02-15 ENCOUNTER — Ambulatory Visit: Payer: 59 | Admitting: Internal Medicine

## 2018-02-15 ENCOUNTER — Encounter: Payer: Self-pay | Admitting: Internal Medicine

## 2018-02-15 VITALS — BP 110/80 | HR 63 | Temp 98.7°F | Ht 72.0 in | Wt 243.4 lb

## 2018-02-15 DIAGNOSIS — E119 Type 2 diabetes mellitus without complications: Secondary | ICD-10-CM

## 2018-02-15 DIAGNOSIS — K219 Gastro-esophageal reflux disease without esophagitis: Secondary | ICD-10-CM

## 2018-02-15 DIAGNOSIS — I10 Essential (primary) hypertension: Secondary | ICD-10-CM | POA: Diagnosis not present

## 2018-02-15 DIAGNOSIS — Z1329 Encounter for screening for other suspected endocrine disorder: Secondary | ICD-10-CM | POA: Diagnosis not present

## 2018-02-15 DIAGNOSIS — J069 Acute upper respiratory infection, unspecified: Secondary | ICD-10-CM

## 2018-02-15 DIAGNOSIS — R972 Elevated prostate specific antigen [PSA]: Secondary | ICD-10-CM

## 2018-02-15 DIAGNOSIS — E559 Vitamin D deficiency, unspecified: Secondary | ICD-10-CM

## 2018-02-15 DIAGNOSIS — Z85828 Personal history of other malignant neoplasm of skin: Secondary | ICD-10-CM | POA: Insufficient documentation

## 2018-02-15 DIAGNOSIS — I480 Paroxysmal atrial fibrillation: Secondary | ICD-10-CM

## 2018-02-15 DIAGNOSIS — L570 Actinic keratosis: Secondary | ICD-10-CM

## 2018-02-15 DIAGNOSIS — E291 Testicular hypofunction: Secondary | ICD-10-CM

## 2018-02-15 MED ORDER — ENALAPRIL MALEATE 2.5 MG PO TABS: 2.5000 mg | ORAL_TABLET | Freq: Every day | ORAL | 3 refills | Status: DC

## 2018-02-15 MED ORDER — METFORMIN HCL 500 MG PO TABS: ORAL_TABLET | ORAL | 3 refills | Status: DC

## 2018-02-15 NOTE — Progress Notes (Signed)
Chief Complaint  Patient presents with  . Follow-up   F/u  1. Elevated PSA disc with radiology today about orbital Xray for foreign body it was negative so pt wants to proceed with prostate MRI instead of bxs disc'ed with  Dr. Junious Silk last PSA went from 7.3 to 5.1 [facility] will schedule  2. URI sx's with runny nose, scratchy throat, no fever, body aches or cough yet started yesterday. Declines flu shot  3. Afib controlled saw Dr. Rockey Situ 11/2017, HTN he did reduce vasotec 5 mg to 1/2 pill qd  4. 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 GERD pt stopped zantac already  6. DM 2 A1C 6.9 08/24/17 and eye exam due he has not schedule yet   Review of Systems  Constitutional: Negative for weight loss.  HENT: Positive for sore throat. Negative for hearing loss.   Eyes: Negative for blurred vision.  Respiratory: Negative for shortness of breath.   Cardiovascular: Negative for chest pain.  Gastrointestinal: Negative for abdominal pain and heartburn.  Genitourinary:       +ED  Musculoskeletal: Negative for falls.  Skin: Negative for rash.  Neurological: Negative for headaches.  Psychiatric/Behavioral: Negative for depression.   Past Medical History:  Diagnosis Date  . A-fib ([place])   . Cancer ([place])    skin bcc face s/p mohs unc-ch  . Chicken pox   . Elevated PSA    follows [facility]   . GERD (gastroesophageal reflux disease)   . Hyperlipidemia   . Hypertension   . Sleep apnea   . Type 2 diabetes mellitus ([place])    Past Surgical History:  Procedure Laterality Date  . APPENDECTOMY    . COLONOSCOPY WITH PROPOFOL N/A 08/17/2015   Procedure: COLONOSCOPY WITH PROPOFOL;  Surgeon: Lollie Sails, MD;  Location: [facility] ENDOSCOPY;  Service: Endoscopy;  Laterality: N/A;  . HERNIA REPAIR    . PROSTATE BIOPSY     10/2016 negative multiple biopsies 2015 per note atypical cells  unable to see this path report   Family History  Problem Relation Age of Onset  . Diabetes Mother   . Cancer Mother        ovarian   . Alcohol abuse Father   . Prostate cancer Father   . Cancer Father        prostate   . Diabetes Maternal Grandmother   . Bladder Cancer Neg Hx   . Kidney cancer Neg Hx    Social History   Socioeconomic History  . Marital status: Married    Spouse name: Not on file  . Number of children: Not on file  . Years of education: Not on file  . Highest education level: Not on file  Occupational History  . Not on file  Social Needs  . Financial resource strain: Not on file  . Food insecurity:    Worry: Not on file    Inability: Not on file  . Transportation needs:    Medical: Not on file    Non-medical: Not on file  Tobacco Use  . Smoking status: Never Smoker  . Smokeless tobacco: Never Used  Substance and Sexual Activity  . Alcohol use: Yes    Alcohol/week: 5.0 standard drinks    Types: 5 Cans of beer per week    Comment: social drinker  . Drug use: No  . Sexual activity:  Yes    Partners: Female  Lifestyle  . Physical activity:    Days per week: Not on file    Minutes per session: Not on file  . Stress: Not on file  Relationships  . Social connections:    Talks on phone: Not on file    Gets together: Not on file    Attends religious service: Not on file    Active member of club or organization: Not on file    Attends meetings of clubs or organizations: Not on file    Relationship status: Not on file  . Intimate partner violence:    Fear of current or ex partner: Not on file    Emotionally abused: Not on file    Physically abused: Not on file    Forced sexual activity: Not on file  Other Topics Concern  . Not on file  Social History Narrative   Married    Works in Scientist, product/process development   never smoker    Current Meds  Medication Sig  . apixaban (ELIQUIS) 5 MG TABS tablet Take 1 tablet (5 mg total) by mouth 2 (two) times  daily.  . Ascorbic Acid (VITAMIN C) 1000 MG tablet Take 1,000 mg by mouth daily.  Marland Kitchen CIALIS 5 MG tablet TAKE 1 TABLET BY MOUTH EVERY DAY AS NEEDED FOR ERECTILE DYSFUNCTION  . CINNAMON PO Take by mouth 3 (three) times daily.  . enalapril (VASOTEC) 5 MG tablet Take 1 tablet (5 mg total) by mouth daily.  Marland Kitchen glucose blood (ACCU-CHEK AVIVA PLUS) test strip USE TWICE DAILY AS DIRECTED E 11.9  . MAGNESIUM PO Take by mouth.  . metFORMIN (GLUCOPHAGE) 500 MG tablet TAKE 1 TABLET BY MOUTH TWICE DAILY WITH A MEAL  . Multiple Vitamins-Minerals (MEGA BASIC PO) Take by mouth.  . nebivolol (BYSTOLIC) 5 MG tablet Take 1 tablet (5 mg total) by mouth daily.  . Omega-3 Fatty Acids (FISH OIL PO) Take by mouth daily.  . ranitidine (ZANTAC) 75 MG tablet Take 75 mg by mouth 2 (two) times daily.  . rosuvastatin (CRESTOR) 5 MG tablet Take 1 tablet (5 mg total) by mouth daily. At night  . sildenafil (REVATIO) 20 MG tablet Take 1 tablet (20 mg total) by mouth 3 (three) times daily as needed.   Allergies  Allergen Reactions  . Penicillin G Anaphylaxis  . Penicillins    No results found for this or any previous visit (from the past 2160 hour(s)). Objective  Body mass index is 33.01 kg/m. Wt Readings from Last 3 Encounters:  02/15/18 243 lb 6.4 oz (110.4 kg)  11/14/17 245 lb (111.1 kg)  08/17/17 245 lb (111.1 kg)   Temp Readings from Last 3 Encounters:  02/15/18 98.7 F (37.1 C) (Oral)  08/15/17 98.8 F (37.1 C) (Oral)  03/14/17 98.5 F (36.9 C) (Oral)   BP Readings from Last 3 Encounters:  02/15/18 110/80  11/14/17 105/76  08/17/17 123/80   Pulse Readings from Last 3 Encounters:  02/15/18 63  11/14/17 65  08/17/17 77    Physical Exam Vitals signs and nursing note reviewed.  Constitutional:      Appearance: Normal appearance. He is normal weight.  HENT:     Head: Normocephalic and atraumatic.     Nose: Nose normal.     Mouth/Throat:     Mouth: Mucous membranes are moist.     Pharynx:  Oropharynx is clear.  Eyes:     Conjunctiva/sclera: Conjunctivae normal.     Pupils: Pupils  are equal, round, and reactive to light.  Cardiovascular:     Rate and Rhythm: Normal rate and regular rhythm.     Heart sounds: Normal heart sounds. No murmur.  Pulmonary:     Effort: Pulmonary effort is normal.     Breath sounds: Normal breath sounds.  Skin:    General: Skin is warm and dry.       Neurological:     General: No focal deficit present.     Mental Status: He is alert and oriented to person, place, and time. Mental status is at baseline.     Gait: Gait normal.  Psychiatric:        Attention and Perception: Attention normal.        Mood and Affect: Mood normal.        Speech: Speech normal.        Behavior: Behavior normal. Behavior is cooperative.        Thought Content: Thought content normal.        Cognition and Memory: Cognition and memory normal.        Judgment: Judgment normal.     Assessment   1. Elevated PSA  2. URI  3. Afib/HTN  4. H/o NMSC s/p excision right forearm  5. GERD  6. DM 2 A1C 6.9  7. HM Plan   1. Disc with Dr. Junious Silk today and pt ok to proceed with prostate MRI Xray orbits negative metal but has metal in left cheek and radiology states as long as orbits clear ok  Urology to order  [facility] PSA upcoming labs  2. Supportive care call back if not better  3. Cont meds rate controlled NSR today reduced vasotec to 2.5 mg taking 1/2 of 5 mg pill qd  Reviewed cards note 11/2017 Dr. Rockey Situ  4. Saw dermatology 2019 +skin cancer right forearm  5. Stop zantac can try pepcid Ac otc if needed  6. Refilled metformin  Check fasting labs asap  rec eye exam  7. Declines flu, pna 23, had Tdap 12/08/15  Declined MMR testing Declined hep B/C/HIV testing  Colonoscopy had 08/17/15 multiple polyps [facility] tubular and hyperplastic would repeat in 5 years  Elevated PSA sch MRI prostate per urology  Saw dermatology 2019 + skin cancer right forearm s/p excision  sch  fasting labs  Pt will be moving to [facility] soon     Provider: Dr. Olivia Mackie McLean-Scocuzza-Internal Medicine

## 2018-02-15 NOTE — Progress Notes (Signed)
Pre visit review using our clinic review tool, if applicable. No additional management support is needed unless otherwise documented below in the visit note. 

## 2018-02-15 NOTE — Patient Instructions (Addendum)
Eye exam due this month/soon  Mucinex DM green or Robitussin DM  Warm tea honey and lemon Warm salt water gargles  lysol  Cough drops   Stop Zantac and rec Pepcid AC OTC

## 2018-02-16 ENCOUNTER — Ambulatory Visit: Payer: 59 | Admitting: Urology

## 2018-02-16 NOTE — Addendum Note (Signed)
Addended by: Billey Co on: 02/16/2018 10:18 AM   Modules accepted: Orders

## 2018-02-21 ENCOUNTER — Other Ambulatory Visit (INDEPENDENT_AMBULATORY_CARE_PROVIDER_SITE_OTHER): Payer: 59

## 2018-02-21 DIAGNOSIS — E291 Testicular hypofunction: Secondary | ICD-10-CM

## 2018-02-21 DIAGNOSIS — Z1329 Encounter for screening for other suspected endocrine disorder: Secondary | ICD-10-CM

## 2018-02-21 DIAGNOSIS — E119 Type 2 diabetes mellitus without complications: Secondary | ICD-10-CM

## 2018-02-21 DIAGNOSIS — E559 Vitamin D deficiency, unspecified: Secondary | ICD-10-CM

## 2018-02-21 DIAGNOSIS — R972 Elevated prostate specific antigen [PSA]: Secondary | ICD-10-CM | POA: Diagnosis not present

## 2018-02-21 DIAGNOSIS — I10 Essential (primary) hypertension: Secondary | ICD-10-CM | POA: Diagnosis not present

## 2018-02-21 NOTE — Addendum Note (Signed)
Addended by: Arby Barrette on: 02/21/2018 08:51 AM   Modules accepted: Orders

## 2018-02-21 NOTE — Addendum Note (Signed)
Addended by: Arby Barrette on: 02/21/2018 08:52 AM   Modules accepted: Orders

## 2018-02-22 LAB — CBC WITH DIFFERENTIAL/PLATELET
Absolute Monocytes: 471 cells/uL (ref 200–950)
Basophils Absolute: 30 cells/uL (ref 0–200)
Basophils Relative: 0.4 %
Eosinophils Absolute: 106 cells/uL (ref 15–500)
Eosinophils Relative: 1.4 %
HCT: 41 % (ref 38.5–50.0)
Hemoglobin: 14.4 g/dL (ref 13.2–17.1)
Lymphs Abs: 2280 cells/uL (ref 850–3900)
MCH: 32.8 pg (ref 27.0–33.0)
MCHC: 35.1 g/dL (ref 32.0–36.0)
MCV: 93.4 fL (ref 80.0–100.0)
MPV: 9.7 fL (ref 7.5–12.5)
Monocytes Relative: 6.2 %
NEUTROS PCT: 62 %
Neutro Abs: 4712 cells/uL (ref 1500–7800)
Platelets: 252 10*3/uL (ref 140–400)
RBC: 4.39 10*6/uL (ref 4.20–5.80)
RDW: 13.3 % (ref 11.0–15.0)
Total Lymphocyte: 30 %
WBC: 7.6 10*3/uL (ref 3.8–10.8)

## 2018-02-22 LAB — LIPID PANEL
Cholesterol: 132 mg/dL (ref <200)
HDL: 43 mg/dL (ref >40)
LDL Cholesterol (Calc): 75 mg/dL (calc)
Non-HDL Cholesterol (Calc): 89 mg/dL (calc) (ref <130)
Total CHOL/HDL Ratio: 3.1 (calc) (ref <5.0)
Triglycerides: 59 mg/dL (ref <150)

## 2018-02-22 LAB — COMPREHENSIVE METABOLIC PANEL
AG Ratio: 2.2 (calc) (ref 1.0–2.5)
ALBUMIN MSPROF: 4.4 g/dL (ref 3.6–5.1)
ALKALINE PHOSPHATASE (APISO): 55 U/L (ref 40–115)
ALT: 38 U/L (ref 9–46)
AST: 22 U/L (ref 10–35)
BUN: 19 mg/dL (ref 7–25)
CO2: 24 mmol/L (ref 20–32)
Calcium: 9.6 mg/dL (ref 8.6–10.3)
Chloride: 105 mmol/L (ref 98–110)
Creat: 0.7 mg/dL (ref 0.70–1.25)
Globulin: 2 g/dL (calc) (ref 1.9–3.7)
Glucose, Bld: 146 mg/dL — ABNORMAL HIGH (ref 65–99)
Potassium: 5 mmol/L (ref 3.5–5.3)
Sodium: 140 mmol/L (ref 135–146)
Total Bilirubin: 0.9 mg/dL (ref 0.2–1.2)
Total Protein: 6.4 g/dL (ref 6.1–8.1)

## 2018-02-22 LAB — URINALYSIS, ROUTINE W REFLEX MICROSCOPIC
Bilirubin, UA: NEGATIVE
Glucose, UA: NEGATIVE
Ketones, UA: NEGATIVE
LEUKOCYTES UA: NEGATIVE
Nitrite, UA: NEGATIVE
Protein, UA: NEGATIVE
RBC, UA: NEGATIVE
Specific Gravity, UA: 1.017 (ref 1.005–1.030)
Urobilinogen, Ur: 0.2 mg/dL (ref 0.2–1.0)
pH, UA: 5.5 (ref 5.0–7.5)

## 2018-02-22 LAB — TSH: TSH: 1.53 m[IU]/L (ref 0.40–4.50)

## 2018-02-22 LAB — VITAMIN D 25 HYDROXY (VIT D DEFICIENCY, FRACTURES): Vit D, 25-Hydroxy: 37 ng/mL (ref 30–100)

## 2018-02-22 LAB — MICROALBUMIN / CREATININE URINE RATIO
Creatinine, Urine: 73.3 mg/dL
Microalb/Creat Ratio: <4.1 mg/g creat (ref 0.0–30.0)
Microalbumin, Urine: <3 ug/mL

## 2018-02-22 LAB — PSA, TOTAL AND FREE
PSA, % Free: 10 % (calc) — ABNORMAL LOW (ref >25)
PSA, Free: 0.6 ng/mL
PSA, TOTAL: 5.9 ng/mL — AB (ref <=4.0)

## 2018-02-22 LAB — HEMOGLOBIN A1C
Hgb A1c MFr Bld: 6.6 % of total Hgb — ABNORMAL HIGH (ref <5.7)
Mean Plasma Glucose: 143 (calc)
eAG (mmol/L): 7.9 (calc)

## 2018-02-22 LAB — TESTOSTERONE: Testosterone: 262 ng/dL (ref 250–827)

## 2018-02-23 NOTE — Progress Notes (Signed)
Patient scheduled for prostate MRI Mar 19, 2018 -- thanks for the update on PSA

## 2018-03-09 ENCOUNTER — Telehealth: Payer: Self-pay | Admitting: *Deleted

## 2018-03-09 NOTE — Telephone Encounter (Signed)
Copied from [place] [phone]. Topic: General - Other >> Mar 09, 2018  8:39 AM Alanda Slim E wrote: Reason for CRM:  Pt would like a call to go over his lab result/ please advise

## 2018-03-09 NOTE — Telephone Encounter (Signed)
Patient wanted to reschedule MRI appointment not lab results.

## 2018-03-15 ENCOUNTER — Other Ambulatory Visit: Payer: Self-pay

## 2018-03-19 ENCOUNTER — Other Ambulatory Visit: Payer: 59

## 2018-03-20 ENCOUNTER — Ambulatory Visit: Payer: 59 | Admitting: Urology

## 2018-03-22 ENCOUNTER — Ambulatory Visit
Admission: RE | Admit: 2018-03-22 | Discharge: 2018-03-22 | Disposition: A | Discharge status: Home / Self Care | Payer: 59 | Source: Ambulatory Visit | Attending: Urology | Admitting: Urology

## 2018-03-22 DIAGNOSIS — R972 Elevated prostate specific antigen [PSA]: Secondary | ICD-10-CM

## 2018-03-22 MED ORDER — GADOBENATE DIMEGLUMINE 529 MG/ML IV SOLN
20.0000 mL | Freq: Once | INTRAVENOUS | Status: AC | PRN
  Administered 2018-03-22: 20 mL via INTRAVENOUS

## 2018-03-29 ENCOUNTER — Encounter: Payer: Self-pay | Admitting: Urology

## 2018-03-29 ENCOUNTER — Ambulatory Visit (INDEPENDENT_AMBULATORY_CARE_PROVIDER_SITE_OTHER): Payer: 59 | Admitting: Urology

## 2018-03-29 VITALS — BP 120/86 | HR 80 | Ht 72.0 in | Wt 247.4 lb

## 2018-03-29 DIAGNOSIS — R972 Elevated prostate specific antigen [PSA]: Secondary | ICD-10-CM | POA: Diagnosis not present

## 2018-03-29 NOTE — Progress Notes (Signed)
03/29/2018 10:22 AM   Brandon [name] 11-20-1955 [id number]  Reason for visit: Follow up elevated PSA, prostate MRI  HPI: I saw Brandon [name] for the first time in urology clinic today for follow-up of elevated PSA and discuss his recent prostate MRI results.  He was previously followed by Dr. Ivory [name] and Dr. Junious [name].  To briefly summarize, he is a 63 year old healthy male with a family history of lethal prostate cancer in his father in his late 38s who has a long history of elevated PSAs.  He underwent his first prostate biopsy in 2015 for a PSA of 3.8 which reportedly showed "atypical cells at the right apex" but no clear evidence of malignancy.  His PSA continued to rise to 5.24 in June 2018, and repeat biopsy was benign.  PSA has varied from 4 to a high of 7.3 in January 2019.  He recently underwent a prostate MRI in January 2020 that showed 41g gland with no suspicious lesions.  He denies any significant urinary symptoms.  Most recent PSA was December 2019, and remained elevated at 5.9, with 10% free.  He denies any weight loss or bone pain   ROS: Please see flowsheet from today's date for complete review of systems.  Physical Exam: BP 120/86 (BP Location: Left Arm, Patient Position: Sitting, Cuff Size: Large)   Pulse 80   Ht 6' (1.829 m)   Wt 247 lb 6.4 oz (112.2 kg)   BMI 33.55 kg/m    Constitutional:  Alert and oriented, No acute distress. Respiratory: Normal respiratory effort, no increased work of breathing. GI: Abdomen is soft, nontender, nondistended, no abdominal masses GU: No CVA tenderness Skin: No rashes, bruises or suspicious lesions. Neurologic: Grossly intact, no focal deficits, moving all 4 extremities. Psychiatric: Normal mood and affect  Laboratory Data:  PSA history 02/2018: 5.9, 10% free 07/2017: 5.1 03/2017: 7.3 08/2016: 5.24 -> negative biopsy 12/2015: 4.19 12/2013: 3.8 -> negative biopsy, "atypical cells"   Pertinent Imaging: Personally reviewed the  MRI prostate, 41 cc gland, no suspicious lesions  Assessment & Plan:   In summary, the patient is a 63 year old male with a family history of lethal prostate cancer in his father who has a long history of elevated PSAs from 5-7.3.  He has undergone 2 prior negative biopsies in 2015 and 2018.  Recent prostate MRI January 2020 showed no suspicious lesions, and prostate volume of 41 cc.  We reviewed the implications of PSA screening and the uncertainty surrounding it. In general, a man's PSA increases with age and is produced by both normal and cancerous prostate tissue. The differential diagnosis for elevated PSA includes BPH, prostate cancer, infection, recent intercourse/ejaculation, recent urethroscopic manipulation (foley placement/cystoscopy) or trauma, and prostatitis.   Management of an elevated PSA can include observation or prostate biopsy and we discussed this in detail. Our goal is to detect clinically significant prostate cancers, and manage with either active surveillance, surgery, or radiation for localized disease. Risks of prostate biopsy include bleeding, infection (including life threatening sepsis), pain, and lower urinary symptoms. Hematuria, hematospermia, and blood in the stool are all common after biopsy and can persist up to 4 weeks.   -Recommend follow-up in 6 months with PSA, if stable can move to yearly screening -If PSA continues to rise, would consider 4K score prior to repeating prostate biopsy in the setting of recent negative prostate MRI  A total of 15 minutes were spent face-to-face with the patient, greater than 50% was spent in patient education,  counseling, and coordination of care regarding PSA screening, MRI results, and 4K score.   Brandon [name], [facility] [address] [place], [place] [address] [phone]

## 2018-03-29 NOTE — Patient Instructions (Signed)

## 2018-07-17 ENCOUNTER — Other Ambulatory Visit: Payer: Self-pay

## 2018-07-17 ENCOUNTER — Ambulatory Visit (INDEPENDENT_AMBULATORY_CARE_PROVIDER_SITE_OTHER): Payer: 59 | Admitting: Internal Medicine

## 2018-07-17 DIAGNOSIS — N400 Enlarged prostate without lower urinary tract symptoms: Secondary | ICD-10-CM | POA: Insufficient documentation

## 2018-07-17 DIAGNOSIS — N521 Erectile dysfunction due to diseases classified elsewhere: Secondary | ICD-10-CM

## 2018-07-17 DIAGNOSIS — I480 Paroxysmal atrial fibrillation: Secondary | ICD-10-CM

## 2018-07-17 DIAGNOSIS — R972 Elevated prostate specific antigen [PSA]: Secondary | ICD-10-CM | POA: Diagnosis not present

## 2018-07-17 DIAGNOSIS — I10 Essential (primary) hypertension: Secondary | ICD-10-CM

## 2018-07-17 DIAGNOSIS — E1169 Type 2 diabetes mellitus with other specified complication: Secondary | ICD-10-CM

## 2018-07-17 DIAGNOSIS — E785 Hyperlipidemia, unspecified: Secondary | ICD-10-CM

## 2018-07-17 DIAGNOSIS — E119 Type 2 diabetes mellitus without complications: Secondary | ICD-10-CM | POA: Diagnosis not present

## 2018-07-17 MED ORDER — ENALAPRIL MALEATE 2.5 MG PO TABS: 2.5000 mg | ORAL_TABLET | Freq: Every day | ORAL | 3 refills | Status: DC

## 2018-07-17 NOTE — Progress Notes (Signed)
Telephone Note failed Doxy  I connected with Brandon [name] on 07/17/18 at  3:12 PM EDT by telephone and verified that I am speaking with the correct person using two identifiers. Location patient: home Location provider:work  Persons participating in the virtual visit: patient, provider  I discussed the limitations of evaluation and management by telemedicine and the availability of in person appointments. The patient expressed understanding and agreed to proceed.   HPI: 1. Elevated PSA 5.9 02/21/18 with MRI 03/22/2018 with BPH + he is not having sx's no dribbling, no increased frequency, start and stop stream ok and stream is not weak and he is emptying bladder ok.  He really does not want another prostate bx in future unless necessary  2. DM 2 A1C 6.6 02/21/18 on metformin 500 mg bid eye MD appt 09/27/2018  3. HTN/Afib on enalapril 2.5 mg qd, bystolic 5 mg qd, eliquis 5 mg mg bid due for f/u with Dr. Rockey Situ 11/25/2018 no appt scheduled as of yet   ROS: See pertinent positives and negatives per HPI.  Past Medical History:  Diagnosis Date  . A-fib ([place])   . Cancer ([place])    skin bcc face s/p mohs unc-ch  . Chicken pox   . Elevated PSA    follows [place] [facility]   . GERD (gastroesophageal reflux disease)   . Hyperlipidemia   . Hypertension   . Sleep apnea   . Type 2 diabetes mellitus ([place])     Past Surgical History:  Procedure Laterality Date  . APPENDECTOMY    . COLONOSCOPY WITH PROPOFOL N/A 08/17/2015   Procedure: COLONOSCOPY WITH PROPOFOL;  Surgeon: Lollie Sails, MD;  [facility] ENDOSCOPY;  Service: Endoscopy;  Laterality: N/A;  . HERNIA REPAIR    . PROSTATE BIOPSY     10/2016 negative multiple biopsies 2015 per note atypical cells unable to see this path report    Family History  Problem Relation Age of Onset  . Diabetes Mother   . Cancer Mother        ovarian   . Alcohol abuse Father   . Prostate cancer Father   . Cancer Father        prostate   . Diabetes  Maternal Grandmother   . Bladder Cancer Neg Hx   . Kidney cancer Neg Hx     SOCIAL HX: married     Current Outpatient Medications:  .  apixaban (ELIQUIS) 5 MG TABS tablet, Take 1 tablet (5 mg total) by mouth 2 (two) times daily., Disp: 180 tablet, Rfl: 3 .  Ascorbic Acid (VITAMIN C) 1000 MG tablet, Take 1,000 mg by mouth daily., Disp: , Rfl:  .  CIALIS 5 MG tablet, TAKE 1 TABLET BY MOUTH EVERY DAY AS NEEDED FOR ERECTILE DYSFUNCTION, Disp: 30 tablet, Rfl: 0 .  CINNAMON PO, Take by mouth 3 (three) times daily., Disp: , Rfl:  .  enalapril (VASOTEC) 2.5 MG tablet, Take 1 tablet (2.5 mg total) by mouth daily., Disp: 90 tablet, Rfl: 3 .  glucose blood (ACCU-CHEK AVIVA PLUS) test strip, USE TWICE DAILY AS DIRECTED E 11.9, Disp: 180 each, Rfl: 3 .  MAGNESIUM PO, Take by mouth., Disp: , Rfl:  .  metFORMIN (GLUCOPHAGE) 500 MG tablet, TAKE 1 TABLET BY MOUTH TWICE DAILY WITH A MEAL, Disp: 180 tablet, Rfl: 3 .  Multiple Vitamins-Minerals (MEGA BASIC PO), Take by mouth., Disp: , Rfl:  .  nebivolol (BYSTOLIC) 5 MG tablet, Take 1 tablet (5 mg total) by mouth daily., Disp:  90 tablet, Rfl: 3 .  Omega-3 Fatty Acids (FISH OIL PO), Take by mouth daily., Disp: , Rfl:  .  rosuvastatin (CRESTOR) 5 MG tablet, Take 1 tablet (5 mg total) by mouth daily. At night, Disp: 90 tablet, Rfl: 3 .  sildenafil (REVATIO) 20 MG tablet, Take 1 tablet (20 mg total) by mouth 3 (three) times daily as needed., Disp: 90 tablet, Rfl: 3  EXAM:  VITALS per patient if applicable:  GENERAL: alert, oriented, appears well and in no acute distress  PSYCH/NEURO: pleasant and cooperative, no obvious depression or anxiety, speech and thought processing grossly intact  ASSESSMENT AND PLAN:  Discussed the following assessment and plan:  Essential hypertension/hld - Plan: enalapril (VASOTEC) 2.5 MG tablet, bystolic 5 mg qd -sch fasting labs 08/23/2018  -cont meds   Type 2 diabetes mellitus without complication, without long-term  current use of insulin (HCC)  -sch fasting labs 08/23/2018  -metformin 500 mg bid  -foot exam at f/u  -eye exam sch 09/27/2018  Urine done 02/2018   Elevated PSA with BPH + on MRI prostate, borderline low testosterone and ED  - Plan: PSA Benign prostatic hyperplasia without lower urinary tract symptoms - Plan: PSA -repeat PSA and will message Dr. Diamantina Providence and ask is there benefit of medical tx of BPH w/o sx's I dont think so but will double check and will see if urology will consider repeat testosterone in future though reviewed side effects of testosterone therapy and pt would not want tx so will not recheck for now   Paroxysmal atrial fibrillation ([place]) due to f/u Dr. Rockey Situ 11/15/2018 appts not currently schedule CC'ed Dr Rockey Situ   HM Declines flu, pna 23, had Tdap10/3/17  Declined MMR testing Declinedhep B/C/HIV testing Colonoscopy had 08/17/15 multiple polyps [facility] tubular and hyperplastic would repeat in 5 years Elevated PSA sch MRI prostate per urology  Saw dermatology 2019 + skin cancer right forearm s/p excision  -per pt dermatology appt canceled 2/2 covid 19   sch fasting labs 08/23/2018  Pt will be moving to [facility] 03/2019    I discussed the assessment and treatment plan with the patient. The patient was provided an opportunity to ask questions and all were answered. The patient agreed with the plan and demonstrated an understanding of the instructions.   The patient was advised to call back or seek an in-person evaluation if the symptoms worsen or if the condition fails to improve as anticipated.  Time spent 15 minutes  Delorise Jackson, MD

## 2018-07-23 ENCOUNTER — Telehealth: Payer: Self-pay

## 2018-07-23 NOTE — Telephone Encounter (Signed)
-----   Message from Valora Corporal, RN sent at 07/23/2018  9:18 AM EDT ----- Regarding: RE: appt From what I read I think she wanted to see if we could schedule him sooner. So maybe July?  Thanks Olin Hauser ----- Message ----- From: Janan Ridge, CMA Sent: 07/23/2018   8:44 AM EDT To: Valora Corporal, RN Subject: RE: appt                                       Patient is not due for a follow up appointment until 11/2018.  That schedule is not out yet.   Thanks [place] ----- Message ----- From: Valora Corporal, RN Sent: 07/23/2018   7:51 AM EDT To: Janan Ridge, CMA Subject: appt                                           Could we see if this patient would like to have a virtual visit?  Thanks ----- Message ----- From: Minna Merritts, MD Sent: 07/22/2018   2:11 PM EDT To: Valora Corporal, RN   ----- Message ----- From: McLean-Scocuzza, Nino Glow, MD Sent: 07/17/2018   6:28 PM EDT To: Minna Merritts, MD  Pt due for f/u for Afib, HTN 11/15/2018 can you all call him to schedule will be 1 year   Thank you  [place]

## 2018-07-23 NOTE — Telephone Encounter (Signed)
Called patient.  Patient stated he is unsure why everyone is pushing him to have his 1 year follow up sooner than Aug.  He stated he was not currently having any symptoms at this time and has been doing very good.  Made him aware I would make Dr. Rockey Situ and his Nurse aware and we could be in touch closer towards Aug to schedule an appointment.

## 2018-08-23 ENCOUNTER — Other Ambulatory Visit: Payer: Self-pay | Admitting: Internal Medicine

## 2018-08-23 ENCOUNTER — Other Ambulatory Visit: Payer: Self-pay

## 2018-08-23 ENCOUNTER — Other Ambulatory Visit (INDEPENDENT_AMBULATORY_CARE_PROVIDER_SITE_OTHER): Payer: 59

## 2018-08-23 DIAGNOSIS — R972 Elevated prostate specific antigen [PSA]: Secondary | ICD-10-CM | POA: Diagnosis not present

## 2018-08-23 DIAGNOSIS — N400 Enlarged prostate without lower urinary tract symptoms: Secondary | ICD-10-CM | POA: Diagnosis not present

## 2018-08-23 DIAGNOSIS — E119 Type 2 diabetes mellitus without complications: Secondary | ICD-10-CM | POA: Diagnosis not present

## 2018-08-23 DIAGNOSIS — I10 Essential (primary) hypertension: Secondary | ICD-10-CM | POA: Diagnosis not present

## 2018-08-23 DIAGNOSIS — R17 Unspecified jaundice: Secondary | ICD-10-CM

## 2018-08-23 DIAGNOSIS — E785 Hyperlipidemia, unspecified: Secondary | ICD-10-CM | POA: Diagnosis not present

## 2018-08-23 LAB — CBC WITH DIFFERENTIAL/PLATELET
Basophils Absolute: 0.1 10*3/uL (ref 0.0–0.1)
Basophils Relative: 0.6 % (ref 0.0–3.0)
Eosinophils Absolute: 0.2 10*3/uL (ref 0.0–0.7)
Eosinophils Relative: 2.5 % (ref 0.0–5.0)
HCT: 44.7 % (ref 39.0–52.0)
Hemoglobin: 15.2 g/dL (ref 13.0–17.0)
Lymphocytes Relative: 36.6 % (ref 12.0–46.0)
Lymphs Abs: 3.2 10*3/uL (ref 0.7–4.0)
MCHC: 34 g/dL (ref 30.0–36.0)
MCV: 95.7 fl (ref 78.0–100.0)
Monocytes Absolute: 0.5 10*3/uL (ref 0.1–1.0)
Monocytes Relative: 6.1 % (ref 3.0–12.0)
Neutro Abs: 4.8 10*3/uL (ref 1.4–7.7)
Neutrophils Relative %: 54.2 % (ref 43.0–77.0)
Platelets: 252 10*3/uL (ref 150.0–400.0)
RBC: 4.67 Mil/uL (ref 4.22–5.81)
RDW: 13.6 % (ref 11.5–15.5)
WBC: 8.8 10*3/uL (ref 4.0–10.5)

## 2018-08-23 LAB — LIPID PANEL
Cholesterol: 132 mg/dL (ref 0–200)
HDL: 44.4 mg/dL (ref >39.00)
LDL Cholesterol: 75 mg/dL (ref 0–99)
NonHDL: 88.03
Total CHOL/HDL Ratio: 3
Triglycerides: 64 mg/dL (ref 0.0–149.0)
VLDL: 12.8 mg/dL (ref 0.0–40.0)

## 2018-08-23 LAB — HEMOGLOBIN A1C: Hgb A1c MFr Bld: 7.2 % — ABNORMAL HIGH (ref 4.6–6.5)

## 2018-08-23 LAB — COMPREHENSIVE METABOLIC PANEL
ALT: 31 U/L (ref 0–53)
AST: 21 U/L (ref 0–37)
Albumin: 4.5 g/dL (ref 3.5–5.2)
Alkaline Phosphatase: 52 U/L (ref 39–117)
BUN: 18 mg/dL (ref 6–23)
CO2: 26 mEq/L (ref 19–32)
Calcium: 9.6 mg/dL (ref 8.4–10.5)
Chloride: 100 mEq/L (ref 96–112)
Creatinine, Ser: 0.76 mg/dL (ref 0.40–1.50)
GFR: 104.26 mL/min (ref >60.00)
Glucose, Bld: 128 mg/dL — ABNORMAL HIGH (ref 70–99)
Potassium: 4.4 mEq/L (ref 3.5–5.1)
Sodium: 136 mEq/L (ref 135–145)
Total Bilirubin: 1.3 mg/dL — ABNORMAL HIGH (ref 0.2–1.2)
Total Protein: 6.4 g/dL (ref 6.0–8.3)

## 2018-08-23 LAB — PSA: PSA: 4.38 ng/mL — ABNORMAL HIGH (ref 0.10–4.00)

## 2018-08-23 NOTE — Addendum Note (Signed)
Addended by: Leeanne Rio on: 08/23/2018 08:47 AM   Modules accepted: Orders

## 2018-09-27 ENCOUNTER — Other Ambulatory Visit: Payer: Self-pay | Admitting: *Deleted

## 2018-09-27 DIAGNOSIS — R972 Elevated prostate specific antigen [PSA]: Secondary | ICD-10-CM

## 2018-09-27 LAB — HM DIABETES EYE EXAM

## 2018-09-28 ENCOUNTER — Other Ambulatory Visit: Payer: 59

## 2018-09-28 ENCOUNTER — Encounter: Payer: Self-pay | Admitting: Internal Medicine

## 2018-10-02 ENCOUNTER — Encounter: Payer: Self-pay | Admitting: Urology

## 2018-10-02 ENCOUNTER — Other Ambulatory Visit: Payer: Self-pay

## 2018-10-02 ENCOUNTER — Ambulatory Visit: Payer: 59 | Admitting: Urology

## 2018-10-02 ENCOUNTER — Ambulatory Visit (INDEPENDENT_AMBULATORY_CARE_PROVIDER_SITE_OTHER): Payer: 59 | Admitting: Urology

## 2018-10-02 VITALS — BP 123/80 | HR 67 | Ht 72.0 in | Wt 237.0 lb

## 2018-10-02 DIAGNOSIS — R972 Elevated prostate specific antigen [PSA]: Secondary | ICD-10-CM | POA: Diagnosis not present

## 2018-10-02 DIAGNOSIS — Z125 Encounter for screening for malignant neoplasm of prostate: Secondary | ICD-10-CM | POA: Diagnosis not present

## 2018-10-02 LAB — BLADDER SCAN AMB NON-IMAGING

## 2018-10-02 NOTE — Progress Notes (Signed)
10/02/2018 9:21 AM   Georgian Co 17-Mar-1955 [id number]  Reason for visit: Follow up history of elevated PSA  HPI: To briefly summarize, he is a 63 year old healthy male with a family history of lethal prostate cancer in his father in his late 57s who has a long history of elevated PSAs.  He underwent his first prostate biopsy in 2015 for a PSA of 3.8 which reportedly showed "atypical cells at the right apex" but no clear evidence of malignancy.  His PSA continued to rise to 5.24 in June 2018, and repeat biopsy was benign.  PSA has varied from 4 to a high of 7.3 in January 2019.  He underwent a prostate MRI in January 2020 that showed 41g gland with no suspicious lesions.  He denies any significant urinary symptoms. He denies any weight loss or bone pain.  PSA December 2019 remained elevated at 5.9, with 10% free. His most recent PSA dropped back down and was 4.38 in 08/2018.    ROS: Please see flowsheet from today's date for complete review of systems.  Physical Exam: BP 123/80 (BP Location: Left Arm, Patient Position: Sitting)    Pulse 67    Ht 6' (1.829 m)    Wt 237 lb (107.5 kg)    BMI 32.14 kg/m    Constitutional:  Alert and oriented, No acute distress. Respiratory: Normal respiratory effort, no increased work of breathing. GI: Abdomen is soft, nontender, nondistended, no abdominal masses Skin: No rashes, bruises or suspicious lesions. Neurologic: Grossly intact, no focal deficits, moving all 4 extremities. Psychiatric: Normal mood and affect  Laboratory Data:  PSA history 08/2018: 4.38 02/2018: 5.9, 10% free 07/2017: 5.1 03/2017: 7.3 08/2016: 5.24 -> negative biopsy 12/2015: 4.19 12/2013: 3.8 -> negative biopsy, "atypical cells"  Assessment & Plan:   In summary, the patient is a 63 year old male with a family history of lethal prostate cancer in his father who has a long history of elevated PSAs from 5-7.3.  He has undergone 2 prior negative biopsies in 2015 and 2018.   Recent prostate MRI January 2020 showed no suspicious lesions, and prostate volume of 41 cc. PSA has dropped back down to 4.38 in June 2020.  We reviewed the implications of PSA screening and the uncertainty surrounding it. In general, a man's PSA increases with age and is produced by both normal and cancerous prostate tissue. The differential diagnosis for elevated PSA includes BPH, prostate cancer, infection, recent intercourse/ejaculation, recent urethroscopic manipulation (foley placement/cystoscopy) or trauma, and prostatitis.   Management of an elevated PSA can include observation or prostate biopsy and we discussed this in detail. Our goal is to detect clinically significant prostate cancers, and manage with either active surveillance, surgery, or radiation for localized disease. Risks of prostate biopsy include bleeding, infection (including life threatening sepsis), pain, and lower urinary symptoms. Hematuria, hematospermia, and blood in the stool are all common after biopsy and can persist up to 4 weeks.   I had a long conversation with the patient about the challenges and imperfections of PSA screening, and the need to look at all data including PSA trend, prior biopsy results, and imaging. He is in agreement for ongoing yearly PSA screening.   -RTC one year for PSA/DRE -If PSA significantly elevated, would consider 4K score prior to repeating prostate biopsy in the setting of negative prostate MRI and 2 prior negative biopsies  A total of 15 minutes were spent face-to-face with the patient, greater than 50% was spent in patient education,  counseling, and coordination of care regarding PSA screening and prostate cancer.   Billey Co, [facility] [address], [place], [place] [address] [phone]

## 2018-10-02 NOTE — Patient Instructions (Signed)

## 2018-10-29 ENCOUNTER — Other Ambulatory Visit: Payer: Self-pay | Admitting: Cardiovascular Disease

## 2018-10-29 DIAGNOSIS — I4891 Unspecified atrial fibrillation: Secondary | ICD-10-CM

## 2018-10-29 DIAGNOSIS — E119 Type 2 diabetes mellitus without complications: Secondary | ICD-10-CM

## 2018-10-29 DIAGNOSIS — I10 Essential (primary) hypertension: Secondary | ICD-10-CM

## 2018-10-29 NOTE — Telephone Encounter (Signed)
Pt's age 63, wt 107.5 kg, SCr 0.76, CrCl 155, last ov w/ TG 11/14/17.

## 2018-10-29 NOTE — Telephone Encounter (Signed)
Please review for refill. Thanks!  

## 2018-12-05 ENCOUNTER — Ambulatory Visit (INDEPENDENT_AMBULATORY_CARE_PROVIDER_SITE_OTHER): Payer: 59 | Admitting: Nurse Practitioner

## 2018-12-05 ENCOUNTER — Other Ambulatory Visit: Payer: Self-pay

## 2018-12-05 ENCOUNTER — Encounter: Payer: Self-pay | Admitting: Nurse Practitioner

## 2018-12-05 VITALS — BP 136/72 | HR 65 | Temp 97.4°F | Ht 72.0 in | Wt 235.2 lb

## 2018-12-05 DIAGNOSIS — E119 Type 2 diabetes mellitus without complications: Secondary | ICD-10-CM

## 2018-12-05 DIAGNOSIS — E782 Mixed hyperlipidemia: Secondary | ICD-10-CM | POA: Diagnosis not present

## 2018-12-05 DIAGNOSIS — I10 Essential (primary) hypertension: Secondary | ICD-10-CM

## 2018-12-05 DIAGNOSIS — I480 Paroxysmal atrial fibrillation: Secondary | ICD-10-CM | POA: Diagnosis not present

## 2018-12-05 DIAGNOSIS — G4733 Obstructive sleep apnea (adult) (pediatric): Secondary | ICD-10-CM

## 2018-12-05 MED ORDER — APIXABAN 5 MG PO TABS: 5.0000 mg | ORAL_TABLET | Freq: Two times a day (BID) | ORAL | 3 refills | Status: DC

## 2018-12-05 MED ORDER — ROSUVASTATIN CALCIUM 5 MG PO TABS: 5.0000 mg | ORAL_TABLET | Freq: Every day | ORAL | 3 refills | Status: DC

## 2018-12-05 MED ORDER — NEBIVOLOL HCL 5 MG PO TABS: 5.0000 mg | ORAL_TABLET | Freq: Every day | ORAL | 3 refills | Status: DC

## 2018-12-05 NOTE — Patient Instructions (Signed)
Medication Instructions:  Your physician recommends that you continue on your current medications as directed. Please refer to the Current Medication list given to you today.  If you need a refill on your cardiac medications before your next appointment, please call your pharmacy.   Lab work: None ordered  If you have labs (blood work) drawn today and your tests are completely normal, you will receive your results only by: Marland Kitchen MyChart Message (if you have MyChart) OR . A paper copy in the mail If you have any lab test that is abnormal or we need to change your treatment, we will call you to review the results.  Testing/Procedures: None ordered   Follow-Up: At [facility], you and your health needs are our priority.  As part of our continuing mission to provide you with exceptional heart care, we have created designated Provider Care Teams.  These Care Teams include your primary Cardiologist (physician) and Advanced Practice Providers (APPs -  Physician Assistants and Nurse Practitioners) who all work together to provide you with the care you need, when you need it. You will need a follow up appointment in 12 months.  Please call our office 2 months in advance to schedule this appointment.  Please see Ida Rogue, MD.

## 2018-12-05 NOTE — Progress Notes (Signed)
Office Visit    Patient Name: Brandon [name] Date of Encounter: 12/05/2018  Primary Care Provider:  McLean-Scocuzza, Nino Glow, MD Primary Cardiologist:  Ida Rogue, MD  Chief Complaint    63 year old male with a history of paroxysmal atrial fibrillation, sleep apnea on CPAP, type 2 diabetes mellitus, hypertension, GERD, and remote alcohol abuse, who presents for follow-up of atrial fibrillation.  Past Medical History    Past Medical History:  Diagnosis Date  . Cancer ([place])    skin bcc face s/p mohs unc-ch  . Chicken pox   . Elevated PSA    follows [facility] [place] [facility]   . GERD (gastroesophageal reflux disease)   . Hyperlipidemia   . Hypertension   . PAF (paroxysmal atrial fibrillation) ([facility])    a. 11/2012-->CHA2DS2VASc = 2->Eliquis; b. 11/2012 Echo: EF 60-65%, nl RV fxn, mild MR. Mod elev RVSP.  Marland Kitchen Sleep apnea   . Type 2 diabetes mellitus ([place])    Past Surgical History:  Procedure Laterality Date  . APPENDECTOMY    . COLONOSCOPY WITH PROPOFOL N/A 08/17/2015   Procedure: COLONOSCOPY WITH PROPOFOL;  Surgeon: Lollie Sails, MD;  Location: [facility] ENDOSCOPY;  Service: Endoscopy;  Laterality: N/A;  . HERNIA REPAIR    . PROSTATE BIOPSY     10/2016 negative multiple biopsies 2015 per note atypical cells unable to see this path report    Allergies  Allergies  Allergen Reactions  . Penicillin G Anaphylaxis  . Penicillins     History of Present Illness    63 year old male with the above past medical history including paroxysmal atrial fibrillation, sleep apnea on CPAP, type 2 diabetes mellitus, hypertension, GERD, and remote alcohol use.  He was diagnosed with atrial fibrillation in September 2014 in the setting of hypomagnesemia and alcohol.  He also had untreated sleep apnea at that time.  Echocardiogram at that time showed normal LV function.  He has been maintained on beta-blocker and Eliquis therapy ever since.    He was last seen in clinic in September 2019.  Over the  past year, he has done well from a cardiac standpoint.  He is now retired and though his year is been stressful in the setting of [place], he has been more active in retirement than he was prior to retirement.  He denies any palpitations, chest pain, dyspnea, PND, orthopnea, dizziness, syncope, edema, or early satiety.  He remains compliant with beta-blocker and Eliquis as well as CPAP.  Home Medications    Prior to Admission medications   Medication Sig Start Date End Date Taking? Authorizing Provider  Ascorbic Acid (VITAMIN C) 1000 MG tablet Take 1,000 mg by mouth daily.   Yes [provider]  BYSTOLIC 5 MG tablet TAKE 1 TABLET BY MOUTH DAILY 10/29/18  Yes Gollan, Kathlene November, MD  CIALIS 5 MG tablet TAKE 1 TABLET BY MOUTH EVERY DAY AS NEEDED FOR ERECTILE DYSFUNCTION 08/18/14  Yes Gollan, Kathlene November, MD  CINNAMON PO Take by mouth 3 (three) times daily.   Yes [provider]  ELIQUIS 5 MG TABS tablet TAKE 1 TABLET BY MOUTH TWICE DAILY 10/29/18  Yes Gollan, Kathlene November, MD  enalapril (VASOTEC) 2.5 MG tablet Take 1 tablet (2.5 mg total) by mouth daily. 07/17/18  Yes McLean-Scocuzza, Nino Glow, MD  glucose blood (ACCU-CHEK AVIVA PLUS) test strip USE TWICE DAILY AS DIRECTED E 11.9 11/01/17  Yes McLean-Scocuzza, Nino Glow, MD  MAGNESIUM PO Take  by mouth.   Yes [provider]  metFORMIN (GLUCOPHAGE) 500 MG tablet TAKE 1 TABLET BY MOUTH TWICE DAILY WITH A MEAL 02/15/18  Yes McLean-Scocuzza, Nino Glow, MD  Multiple Vitamins-Minerals (MEGA BASIC PO) Take by mouth.   Yes [provider]  Omega-3 Fatty Acids (FISH OIL PO) Take by mouth daily.   Yes [provider]  rosuvastatin (CRESTOR) 5 MG tablet TAKE 1 TABLET BY MOUTH AT NIGHT 10/29/18  Yes Gollan, Kathlene November, MD  sildenafil (REVATIO) 20 MG tablet Take 1 tablet (20 mg total) by mouth 3 (three) times daily as needed. 11/14/17  Yes Minna Merritts, MD    Review of  Systems    He denies chest pain, palpitations, dyspnea, pnd, orthopnea, n, v, dizziness, syncope, edema, weight gain, or early satiety.  Has had some life stress in the setting of selling his home and planning to move.  All other systems reviewed and are otherwise negative except as noted above.  Physical Exam    VS:  BP 136/72 (BP Location: Left Arm, Patient Position: Sitting, Cuff Size: Normal)   Pulse 65   Temp (!) 97.4 F (36.3 C)   Ht 6' (1.829 m)   Wt 235 lb 4 oz (106.7 kg)   BMI 31.91 kg/m  , BMI Body mass index is 31.91 kg/m. GEN: Well nourished, well developed, in no acute distress. HEENT: normal. Neck: Supple, no JVD, carotid bruits, or masses. Cardiac: RRR, no murmurs, rubs, or gallops. No clubbing, cyanosis, edema.  Radials/DP/PT 2+ and equal bilaterally.  Respiratory:  Respirations regular and unlabored, clear to auscultation bilaterally. GI: Soft, nontender, nondistended, BS + x 4. MS: no deformity or atrophy. Skin: warm and dry, no rash. Neuro:  Strength and sensation are intact. Psych: Normal affect.  Accessory Clinical Findings    ECG personally reviewed by me today -regular sinus rhythm, 65 - no acute changes.  Lab Results  Component Value Date   WBC 8.8 08/23/2018   HGB 15.2 08/23/2018   HCT 44.7 08/23/2018   MCV 95.7 08/23/2018   PLT 252.0 08/23/2018   Lab Results  Component Value Date   CREATININE 0.76 08/23/2018   BUN 18 08/23/2018   NA 136 08/23/2018   K 4.4 08/23/2018   CL 100 08/23/2018   CO2 26 08/23/2018   Lab Results  Component Value Date   ALT 31 08/23/2018   AST 21 08/23/2018   ALKPHOS 52 08/23/2018   BILITOT 1.3 (H) 08/23/2018   Lab Results  Component Value Date   CHOL 132 08/23/2018   HDL 44.40 08/23/2018   LDLCALC 75 08/23/2018   TRIG 64.0 08/23/2018   CHOLHDL 3 08/23/2018     Assessment & Plan    1.  Paroxysmal atrial fibrillation: Patient has been doing well over the past year without any palpitations.  He remains in  sinus rhythm today.  He is on beta-blocker and Eliquis therapy with stable lab work in June.  He is due for refills today and we will send these in.  2.  Essential hypertension: Blood pressure mildly elevated today.  We reviewed his trends over the past 2 years and he is mostly in the 120s when seen.  He does have a cuff at home but does not frequently use it.  I did recommend that he check his blood pressure at least 3 times per week and record and that if his pressures were trending in the 130s to contact us as we would increase his enalapril  to 5 mg daily.  3.  Hyperlipidemia: On low-dose Crestor with an LDL of 75 in June.  AST and ALT were normal in June.  4.  Obstructive sleep apnea: Compliant with CPAP and doing well.  5.  Type 2 diabetes mellitus: A1c was 7.2 in June, which was up from 6.6 in December.  He remains on metformin therapy and is followed closely by primary care.  He does look forward to increasing his activity.  6.  Disposition: Follow-up in clinic in 1 year or sooner if necessary.   Murray Hodgkins, NP 12/05/2018, 10:33 AM

## 2019-01-18 ENCOUNTER — Telehealth: Payer: Self-pay | Admitting: *Deleted

## 2019-01-18 NOTE — Telephone Encounter (Signed)
I dont know anyone particular but Ive done research and possibly Dr. Ewing Schlein (also other providers in the office) [facility]  [place] has location [place]  [phone]

## 2019-01-18 NOTE — Telephone Encounter (Signed)
Copied from [place] [id number]. Topic: General - Other >> Jan 18, 2019 11:38 AM Yvette Rack wrote: Reason for CRM: Pt stated he has moved to [facility] and would like a recommendation for a primary care provider if possible. Pt requests call back

## 2019-01-23 ENCOUNTER — Ambulatory Visit: Payer: 59 | Admitting: Internal Medicine

## 2019-01-23 NOTE — Telephone Encounter (Signed)
Patient was informed.  Patient understood and no questions, comments, or concerns at this time.  

## 2019-02-19 ENCOUNTER — Other Ambulatory Visit: Payer: Self-pay | Admitting: Internal Medicine

## 2019-02-19 DIAGNOSIS — I10 Essential (primary) hypertension: Secondary | ICD-10-CM

## 2019-02-19 MED ORDER — ENALAPRIL MALEATE 2.5 MG PO TABS: 2.5000 mg | ORAL_TABLET | Freq: Every day | ORAL | 3 refills | Status: DC

## 2019-03-05 ENCOUNTER — Other Ambulatory Visit: Payer: Self-pay | Admitting: Internal Medicine

## 2019-03-05 DIAGNOSIS — E119 Type 2 diabetes mellitus without complications: Secondary | ICD-10-CM

## 2019-03-05 MED ORDER — METFORMIN HCL 500 MG PO TABS: ORAL_TABLET | ORAL | 3 refills | Status: AC

## 2019-05-14 ENCOUNTER — Telehealth: Payer: Self-pay | Admitting: Cardiovascular Disease

## 2019-05-14 NOTE — Telephone Encounter (Signed)
°*  STAT* If patient is at the pharmacy, call can be transferred to refill team.   1. Which medications need to be refilled? (please list name of each medication and dose if known) Eliquis 5 mg po BID   2. Which pharmacy/location (including street and city if local pharmacy) is medication to be sent to? [facility] [place]  [phone]   3. Do they need a 30 day or 90 day supply? 90    Patient was told he needs prior auth

## 2019-05-14 NOTE — Telephone Encounter (Signed)
Prior Auth initiated in [email or web address] [employer] plan ID# [id number] BIN [id number] PCN [id number] Grp Rx [id number] Waiting for determination.

## 2019-05-15 ENCOUNTER — Telehealth: Payer: Self-pay

## 2019-05-15 ENCOUNTER — Other Ambulatory Visit: Payer: Self-pay | Admitting: Cardiovascular Disease

## 2019-05-15 ENCOUNTER — Other Ambulatory Visit: Payer: Self-pay

## 2019-05-15 DIAGNOSIS — I10 Essential (primary) hypertension: Secondary | ICD-10-CM

## 2019-05-15 NOTE — Telephone Encounter (Signed)
*  STAT* If patient is at the pharmacy, call can be transferred to refill team.   1. Which medications need to be refilled? (please list name of each medication and dose if known)  Bystolic per wife but med list and ov not say crestor  2. Which pharmacy/location (including street and city if local pharmacy) is medication to be sent to? [facility] [place]   3. Do they need a 30 day or 90 day supply? [place]

## 2019-05-15 NOTE — Telephone Encounter (Signed)
Called patient to make then aware of the process.  Patient is currently out of this medication and can not pickup samples due to being in [place].   Just wanted to make you aware

## 2019-05-15 NOTE — Telephone Encounter (Signed)
Medication was sent to pharmacy.  Needs a PA. Already started Awaiting determination.

## 2019-05-15 NOTE — Telephone Encounter (Signed)
Incoming fax from [employer].   Patient needs a PA for Bystolic 5MG   Called patients insurance company at [phone] PA started with [place] over the phone.  PA # - [id number]  We we receive notification via fax within 24-48 hours.

## 2019-05-16 NOTE — Telephone Encounter (Signed)
Incoming fax from [place] stating that Eliquis did not require a PA at this time but it may in the future.

## 2019-05-17 ENCOUNTER — Telehealth: Payer: Self-pay | Admitting: Cardiovascular Disease

## 2019-05-17 NOTE — Telephone Encounter (Signed)
Pt was placed on his wife's insurance at work so he may be eligible to use the co-pay card. She is going to try that this weekend and will let us know on Monday if this works. Closing this encounter because there is another one in as well.

## 2019-05-17 NOTE — Telephone Encounter (Signed)
Patient wife calling from pharmacy and states refill was denied .   Please call to discuss plan

## 2019-05-17 NOTE — Telephone Encounter (Signed)
PA for Bystolic 5MG  was denied Per patient.   I have not received a fax regarding this yet.

## 2019-05-17 NOTE — Telephone Encounter (Signed)
Are we able to do this in covermymeds?

## 2019-05-17 NOTE — Telephone Encounter (Signed)
Please call regarding PA for Bystolic

## 2019-05-17 NOTE — Telephone Encounter (Signed)
Patient has been on Metoprolol and enalapril then was tried on Bystolic. He is currently on both enalapril and bystolic which with insurance change they no longer wanted to cover Bystolic. Reviewed that if he is on commercial coverage there is a co-pay card he could try to use. Provided her with website to review for this card and to check with pharmacy over the weekend to see if they can use that. If they are able to use that then I can send in 90 day supply with refills for him to get. She was so appreciative for the help with no further questions at this time.

## 2019-05-17 NOTE — Telephone Encounter (Signed)
PA was completed over the phone with the insurance company.  The number is listed below.

## 2019-05-20 NOTE — Telephone Encounter (Signed)
Patient calling in stating she found the copay card and the pharmacy will accept it. Patients wife says it should work out but if there are any issues she will call back.

## 2019-05-20 NOTE — Telephone Encounter (Signed)
Spoke with patients wife per release form and told her that she did get a copay card. Pharmacy is still processing at this time and will not pick up until tomorrow. Requested that she give me a call back if she should have any further problems. She verbalized understanding with no further questions.

## 2019-05-22 NOTE — Telephone Encounter (Signed)
Patient still cannot afford 90 day supply of $150   Please call to discuss what can be done now.

## 2019-05-22 NOTE — Telephone Encounter (Signed)
Spoke with patients wife and she went to get his medication and it costed her $55.00. She states that we need to enter all of the medications he has tried in the prior authorization in order for it to get approved. He has been on this medication for years and now he has 30 day supply. Advised that I would have them take a look at this to see if any updates on authorization, if we can list other meds tried, and she is going to call the company directly to see what they can do for them. [place]. They are looking for new PCP and new Cardiologist. Requested that she call me on Monday to update me if the company offered any special assistance and I could update her on if we have heard anything. She verbalized understanding, was agreeable with this plan, and reports he has 30 day script for [place] to figure this out. Let her know that if I didn't call back on Monday that I would call back on Tuesday. She was ok with this time frame and will see if Theadora Rama can review for any other possible information that could assist with this process.

## 2019-05-23 NOTE — Telephone Encounter (Signed)
Patient states he has recently retired and is now on his [employer] cabin [employer]. ID# [id number] Spoke with Gaspar Garbe at [place] [phone]. PA for Bystolic was denied because patient has to try and fail 3 formulary medications or have contraindications to (all of) the following: carvedilol, atenolol, nadolol, propranolol, and metoprolol. Patient has tried and failed metoprolol and enalapril in the past which was included on the PA however per [place] enalapril is not one of their preferred formulary alternatives. There has to be three tried/failed so failing metoprolol alone does not qualify this patient for approval.

## 2019-05-24 MED ORDER — NEBIVOLOL HCL 5 MG PO TABS: 5.0000 mg | ORAL_TABLET | Freq: Every day | ORAL | 11 refills | Status: DC

## 2019-05-24 MED ORDER — ENALAPRIL MALEATE 2.5 MG PO TABS: 2.5000 mg | ORAL_TABLET | Freq: Every day | ORAL | 3 refills | Status: DC

## 2019-05-24 MED ORDER — ATENOLOL 25 MG PO TABS: 12.5000 mg | ORAL_TABLET | Freq: Two times a day (BID) | ORAL | 3 refills | Status: DC

## 2019-05-24 NOTE — Telephone Encounter (Signed)
Could try atenolol 12.5 mg daily If BP permits, we can increase up to 25 mg after first 30 days

## 2019-05-24 NOTE — Telephone Encounter (Signed)
Call to patient to make him aware of change in medication per Dr. Rockey Situ.   Pt reports that he is on enalapril  And bystolic which is working well.   His new insurance company is stating that they will only pay for bystolic when 3 other have been tried and failed. He is tolerating enalapril just fine.   They currently have a coupon card that is allowing him to get bystolic for [id number] for the rest of the year.   Pt and wife concerned when the coupon card runs out how they will pay for it.   I told them I would reach out to [facility] and ask if she has any further advice.   Advised pt to call for any further questions or concerns.

## 2019-07-08 ENCOUNTER — Other Ambulatory Visit: Payer: Self-pay

## 2019-07-08 MED ORDER — NEBIVOLOL HCL 5 MG PO TABS: 5.0000 mg | ORAL_TABLET | Freq: Every day | ORAL | 1 refills | Status: DC

## 2019-07-22 ENCOUNTER — Telehealth: Payer: Self-pay | Admitting: *Deleted

## 2019-07-22 NOTE — Telephone Encounter (Signed)
Pt requiring PA for Bystolic 5 mg tablet. PA request received via fax from pharmacy.  *Must use Gx. Med necessity only [phone]- non formulary drug. Potential alternatives Metoprolol tartrate 50 mg tablet or Metoprolol Succinate 100 mg tablet.  PA has been submitted through Covermymeds. Awaiting approval.

## 2019-07-25 NOTE — Telephone Encounter (Signed)
Deniedon May 19 Your PA request has been denied. Additional information will be provided in the denial communication. (Message 1140) Your PA request has been denied. Additional information will be provided in the denial communication. (Message 1140)

## 2019-10-08 ENCOUNTER — Ambulatory Visit: Payer: 59 | Admitting: Urology

## 2019-11-03 ENCOUNTER — Other Ambulatory Visit: Payer: Self-pay | Admitting: Cardiovascular Disease

## 2019-11-03 DIAGNOSIS — I10 Essential (primary) hypertension: Secondary | ICD-10-CM

## 2019-11-25 DIAGNOSIS — R972 Elevated prostate specific antigen [PSA]: Secondary | ICD-10-CM | POA: Insufficient documentation

## 2020-01-13 ENCOUNTER — Telehealth: Payer: Self-pay | Admitting: Cardiovascular Disease

## 2020-01-13 NOTE — Telephone Encounter (Signed)
Patient declined recall moved

## 2020-06-14 IMAGING — CR DG ORBITS FOR FOREIGN BODY
1 series · 2 of 2 positions shown · non-contrast
Comparison: None.

CLINICAL DATA: Metal working/exposure; clearance prior to MRI

EXAM:
ORBITS FOR FOREIGN BODY - 2 VIEW

[Series 1: dg eye foreign body · 0.14mm/px · 2 of 2 slices shown]
[im 1/2]
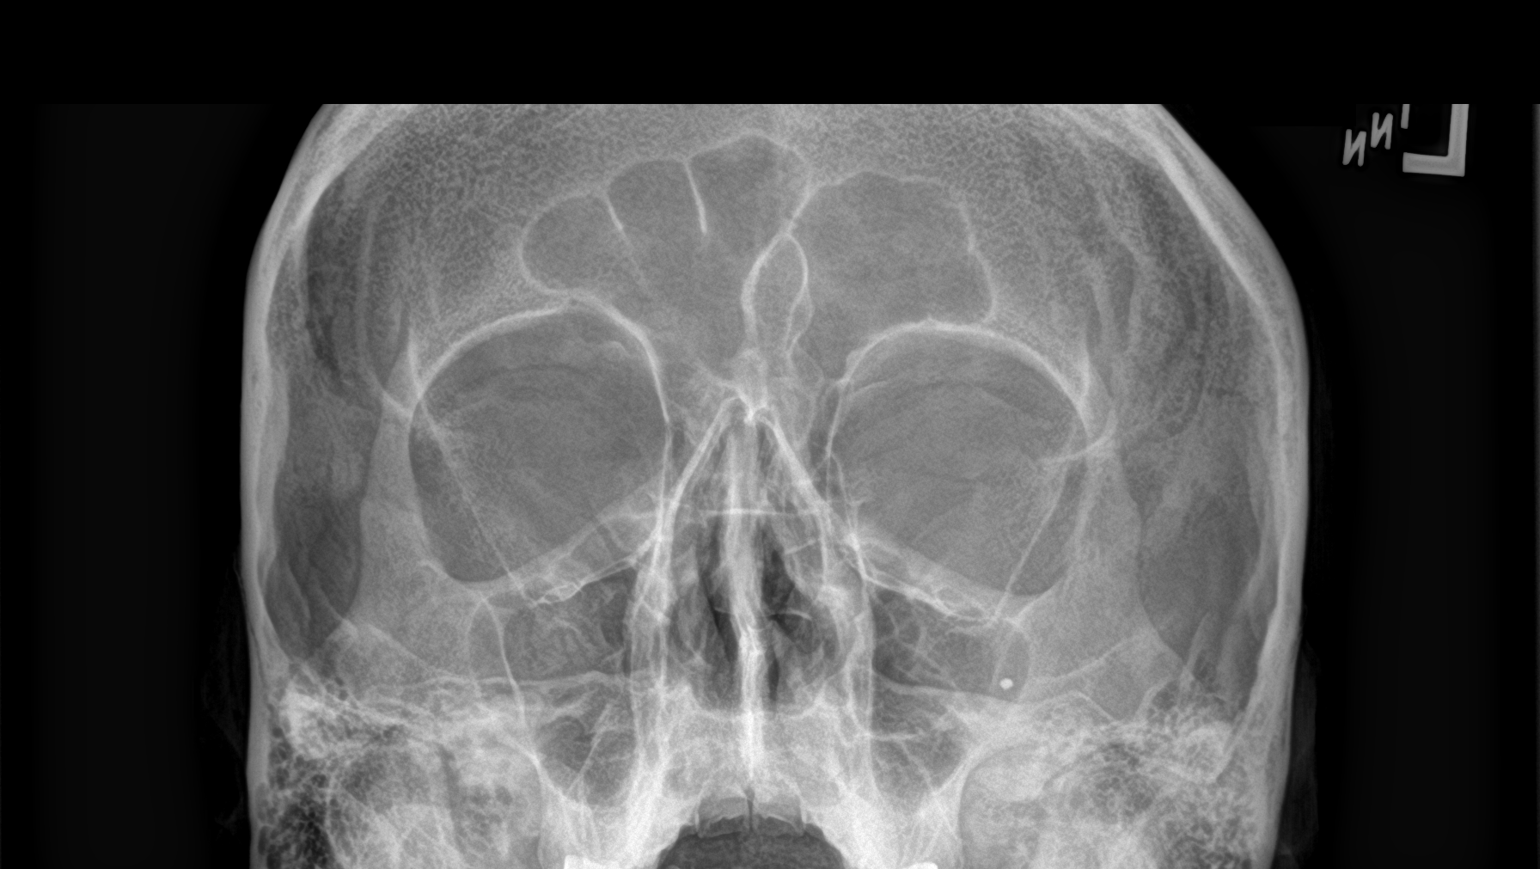
[im 2/2]
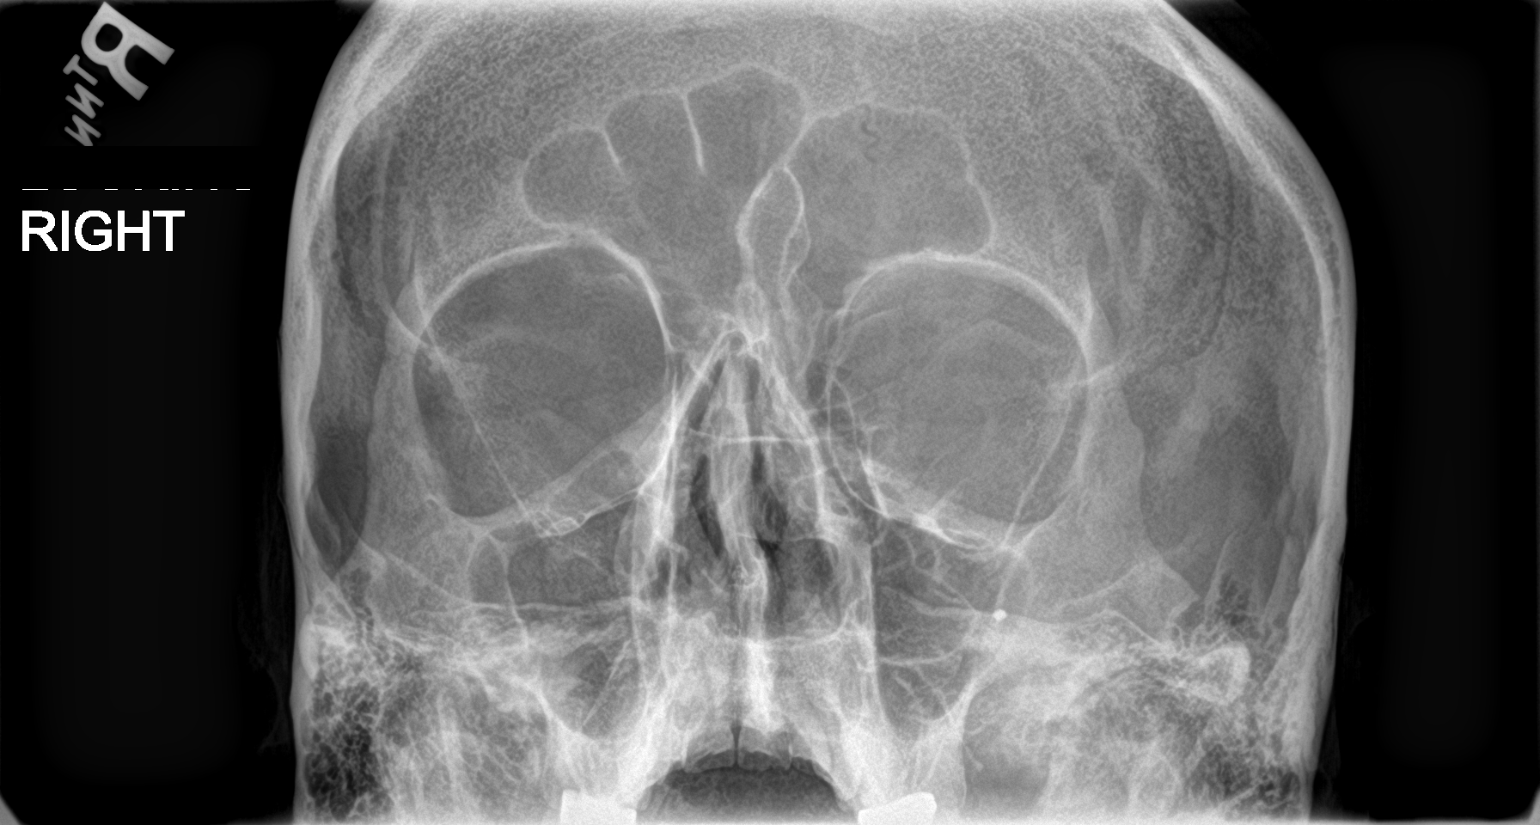

[2 of 2 positions shown; findings below may reference images not displayed]

FINDINGS: Water's views with eyes deviated toward the left and toward the
right were obtained. No intraorbital radiopaque foreign body. A
small radiopaque foreign body is noted in the left maxillary antrum.
Paranasal sinuses are otherwise clear. No fracture or dislocation.
IMPRESSION: No evidence of metallic foreign body within the orbits. Small
metallic foreign body noted in the periphery of the left maxillary
antrum.

## 2020-12-20 IMAGING — MR MR PROSTATE WO/W CM
56 series · 56 of 56 positions shown · IV contrast (Multihance 20ml)
Comparison: None.

CLINICAL DATA: Elevated PSA, negative biopsy

EXAM:
MR PROSTATE WITHOUT AND WITH CONTRAST
TECHNIQUE: Multiplanar multisequence MRI images were obtained of the pelvis
centered about the prostate. Pre and post contrast images were
obtained.
CONTRAST:  20mL MULTIHANCE GADOBENATE DIMEGLUMINE 529 MG/ML IV SOLN

[Series 3: T1 · axial · 8.0mm · 1.06mm/px · 1 of 28 slices shown (1 of 2)]
[im 1/28]
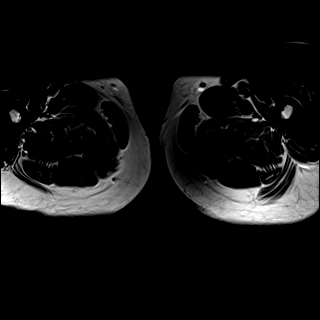

[Series 4: bSSFP fat-sat · axial · 8.0mm · 0.74mm/px · 1 of 28 slices shown]
[im 1/28]
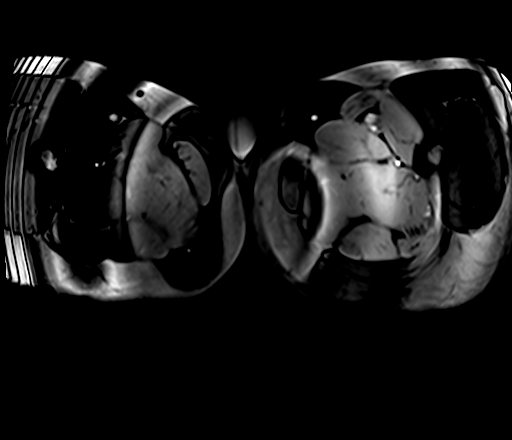

[Series 5: T2 · sagittal · 3.5mm · 0.56mm/px · 1 of 46 slices shown (1 of 4)]
[im 1/46]
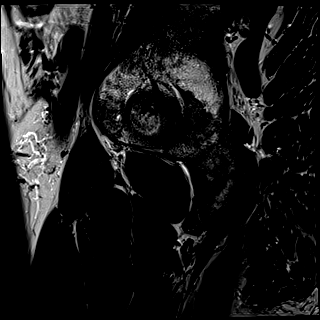

[Series 6: T1 · axial · 3.0mm · 0.31mm/px · 1 of 24 slices shown (2 of 2)]
[im 1/24]
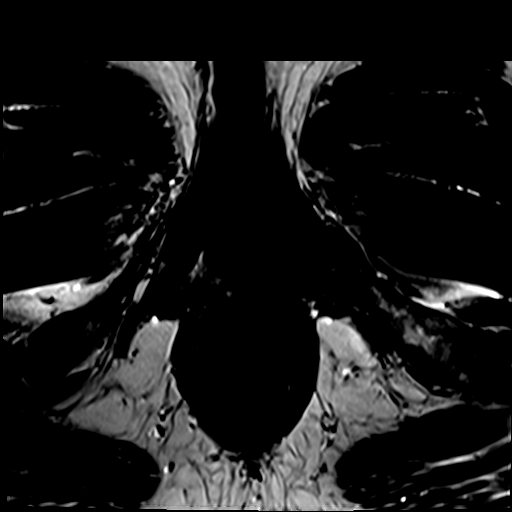

[Series 7: T2 · axial · 3.5mm · 0.56mm/px · 1 of 23 slices shown (2 of 4)]
[im 1/23]
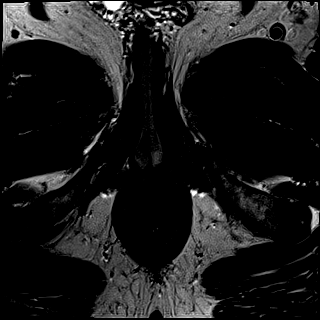

[Series 8: T2 · axial · 1.0mm · 1.04mm/px · 1 of 80 slices shown (3 of 4)]
[im 1/80]
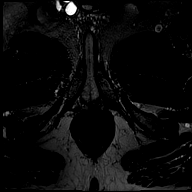

[Series 9: T2 · coronal · 3.5mm · 0.56mm/px · 1 of 23 slices shown (4 of 4)]
[im 1/23]
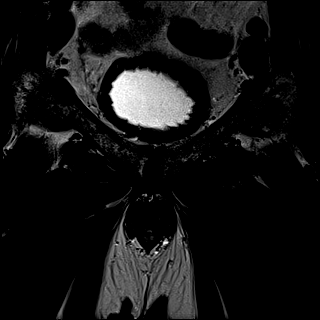

[Series 10: DWI · axial · 3.5mm · 1.56mm/px · 1 of 66 slices shown (1 of 2)]
[im 1/66]
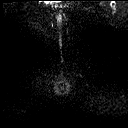

[Series 11: DWI · axial · 3.5mm · 1.56mm/px · 1 of 22 slices shown (2 of 2)]
[im 1/22]
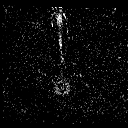

[Series 12: pre t1_twist_tra_dyn_ttc=5.8s · axial · non-contrast · 3.5mm · 0.83mm/px · 1 of 22 slices shown]
[im 1/22]
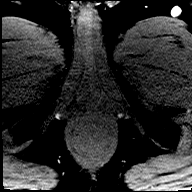

[Series 13: post t1_twist_tra_dyn-copy center · axial · 3.5mm · 0.83mm/px · 1 of 22 slices shown (1 of 24)]
[im 1/22]
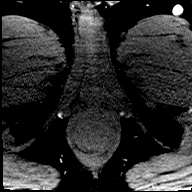

[Series 14: post t1_twist_tra_dyn-copy center · axial · 3.5mm · 0.83mm/px · 1 of 22 slices shown (2 of 24)]
[im 1/22]
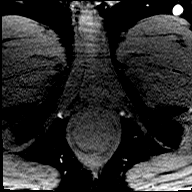

[Series 15: post t1_twist_tra_dyn-copy cent_sub_ttc=(id) · axial · 3.5mm · 0.83mm/px · 1 of 14 slices shown (1 of 22)]
[im 1/14]
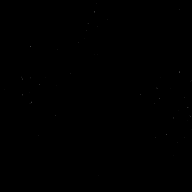

[Series 16: post t1_twist_tra_dyn-copy center · axial · 3.5mm · 0.83mm/px · 1 of 22 slices shown (3 of 24)]
[im 1/22]
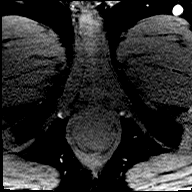

[Series 17: post t1_twist_tra_dyn-copy cent_sub_ttc=(id) · axial · 3.5mm · 0.83mm/px · 1 of 19 slices shown (2 of 22)]
[im 1/19]
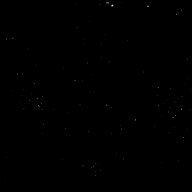

[Series 18: post t1_twist_tra_dyn-copy center · axial · 3.5mm · 0.83mm/px · 1 of 22 slices shown (4 of 24)]
[im 1/22]
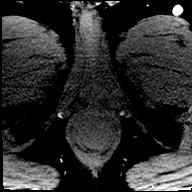

[Series 19: post t1_twist_tra_dyn-copy cent_sub_ttc=(id) · axial · 3.5mm · 0.83mm/px · 1 of 22 slices shown (3 of 22)]
[im 1/22]
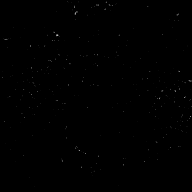

[Series 20: post t1_twist_tra_dyn-copy center · axial · 3.5mm · 0.83mm/px · 1 of 22 slices shown (5 of 24)]
[im 1/22]
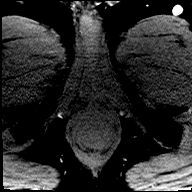

[Series 21: post t1_twist_tra_dyn-copy cent_sub_ttc=(id) · axial · 3.5mm · 0.83mm/px · 1 of 22 slices shown (4 of 22)]
[im 1/22]
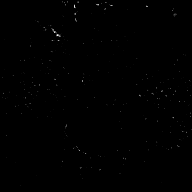

[Series 22: post t1_twist_tra_dyn-copy center · axial · 3.5mm · 0.83mm/px · 1 of 22 slices shown (6 of 24)]
[im 1/22]
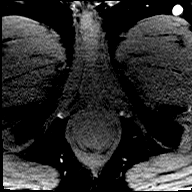

[Series 23: post t1_twist_tra_dyn-copy cent_sub_ttc=(id) · axial · 3.5mm · 0.83mm/px · 1 of 22 slices shown (5 of 22)]
[im 1/22]
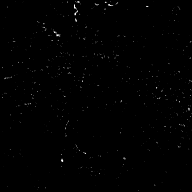

[Series 24: post t1_twist_tra_dyn-copy center · axial · 3.5mm · 0.83mm/px · 1 of 22 slices shown (7 of 24)]
[im 1/22]
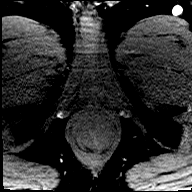

[Series 25: post t1_twist_tra_dyn-copy cent_sub_ttc=(id) · axial · 3.5mm · 0.83mm/px · 1 of 22 slices shown (6 of 22)]
[im 1/22]
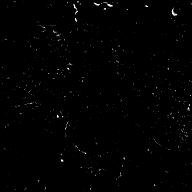

[Series 26: post t1_twist_tra_dyn-copy center · axial · 3.5mm · 0.83mm/px · 1 of 22 slices shown (8 of 24)]
[im 1/22]
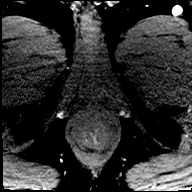

[Series 27: post t1_twist_tra_dyn-copy cent_sub_ttc=(id) · axial · 3.5mm · 0.83mm/px · 1 of 22 slices shown (7 of 22)]
[im 1/22]
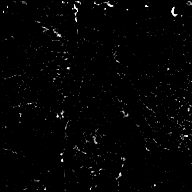

[Series 28: post t1_twist_tra_dyn-copy center · axial · 3.5mm · 0.83mm/px · 1 of 22 slices shown (9 of 24)]
[im 1/22]
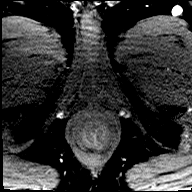

[Series 29: post t1_twist_tra_dyn-copy cent_sub_ttc=(id) · axial · 3.5mm · 0.83mm/px · 1 of 22 slices shown (8 of 22)]
[im 1/22]
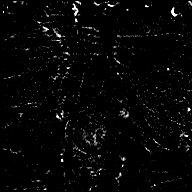

[Series 30: post t1_twist_tra_dyn-copy center · axial · 3.5mm · 0.83mm/px · 1 of 22 slices shown (10 of 24)]
[im 1/22]
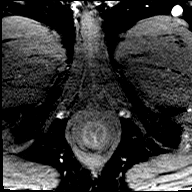

[Series 31: post t1_twist_tra_dyn-copy cent_sub_ttc=(id) · axial · 3.5mm · 0.83mm/px · 1 of 22 slices shown (9 of 22)]
[im 1/22]
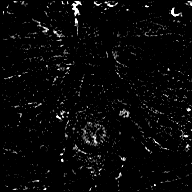

[Series 32: post t1_twist_tra_dyn-copy center · axial · 3.5mm · 0.83mm/px · 1 of 22 slices shown (11 of 24)]
[im 1/22]
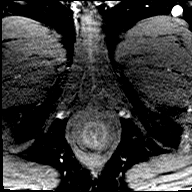

[Series 33: post t1_twist_tra_dyn-copy cent_sub_ttc=(id) · axial · 3.5mm · 0.83mm/px · 1 of 22 slices shown (10 of 22)]
[im 1/22]
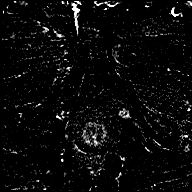

[Series 34: post t1_twist_tra_dyn-copy center · axial · 3.5mm · 0.83mm/px · 1 of 22 slices shown (12 of 24)]
[im 1/22]
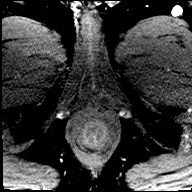

[Series 35: post t1_twist_tra_dyn-copy cent_sub_ttc=(id) · axial · 3.5mm · 0.83mm/px · 1 of 22 slices shown (11 of 22)]
[im 1/22]
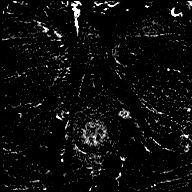

[Series 36: post t1_twist_tra_dyn-copy center · axial · 3.5mm · 0.83mm/px · 1 of 22 slices shown (13 of 24)]
[im 1/22]
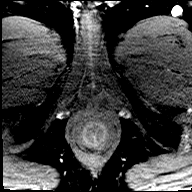

[Series 37: post t1_twist_tra_dyn-copy cent_sub_ttc=(id) · axial · 3.5mm · 0.83mm/px · 1 of 22 slices shown (12 of 22)]
[im 1/22]
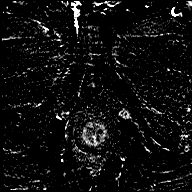

[Series 38: post t1_twist_tra_dyn-copy center · axial · 3.5mm · 0.83mm/px · 1 of 22 slices shown (14 of 24)]
[im 1/22]
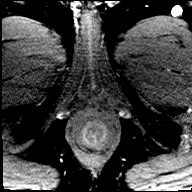

[Series 39: post t1_twist_tra_dyn-copy cent_sub_ttc=(id) · axial · 3.5mm · 0.83mm/px · 1 of 22 slices shown (13 of 22)]
[im 1/22]
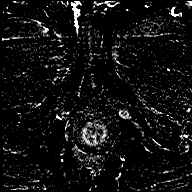

[Series 40: post t1_twist_tra_dyn-copy center · axial · 3.5mm · 0.83mm/px · 1 of 22 slices shown (15 of 24)]
[im 1/22]
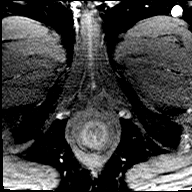

[Series 41: post t1_twist_tra_dyn-copy cent_sub_ttc=(id) · axial · 3.5mm · 0.83mm/px · 1 of 22 slices shown (14 of 22)]
[im 1/22]
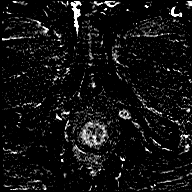

[Series 42: post t1_twist_tra_dyn-copy center · axial · 3.5mm · 0.83mm/px · 1 of 22 slices shown (16 of 24)]
[im 1/22]
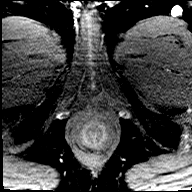

[Series 43: post t1_twist_tra_dyn-copy cent_sub_ttc=(id) · axial · 3.5mm · 0.83mm/px · 1 of 22 slices shown (15 of 22)]
[im 1/22]
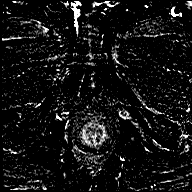

[Series 44: post t1_twist_tra_dyn-copy center · axial · 3.5mm · 0.83mm/px · 1 of 22 slices shown (17 of 24)]
[im 1/22]
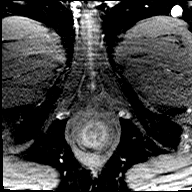

[Series 45: post t1_twist_tra_dyn-copy cent_sub_ttc=(id) · axial · 3.5mm · 0.83mm/px · 1 of 22 slices shown (16 of 22)]
[im 1/22]
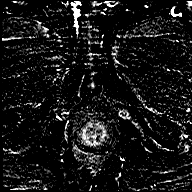

[Series 46: post t1_twist_tra_dyn-copy center · axial · 3.5mm · 0.83mm/px · 1 of 22 slices shown (18 of 24)]
[im 1/22]
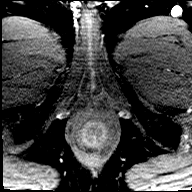

[Series 47: post t1_twist_tra_dyn-copy cent_sub_ttc=(id) · axial · 3.5mm · 0.83mm/px · 1 of 22 slices shown (17 of 22)]
[im 1/22]
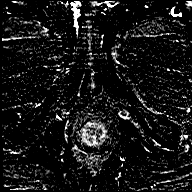

[Series 48: post t1_twist_tra_dyn-copy center · axial · 3.5mm · 0.83mm/px · 1 of 22 slices shown (19 of 24)]
[im 1/22]
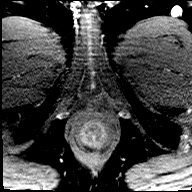

[Series 49: post t1_twist_tra_dyn-copy cent_sub_ttc=(id) · axial · 3.5mm · 0.83mm/px · 1 of 22 slices shown (18 of 22)]
[im 1/22]
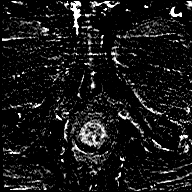

[Series 50: post t1_twist_tra_dyn-copy center · axial · 3.5mm · 0.83mm/px · 1 of 22 slices shown (20 of 24)]
[im 1/22]
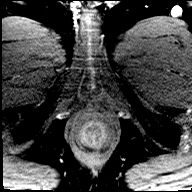

[Series 51: post t1_twist_tra_dyn-copy cent_sub_ttc=(id) · axial · 3.5mm · 0.83mm/px · 1 of 22 slices shown (19 of 22)]
[im 1/22]
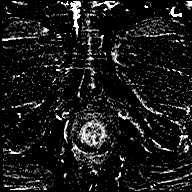

[Series 52: post t1_twist_tra_dyn-copy center · axial · 3.5mm · 0.83mm/px · 1 of 22 slices shown (21 of 24)]
[im 1/22]
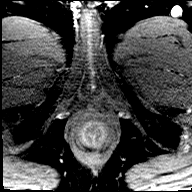

[Series 53: post t1_twist_tra_dyn-copy cent_sub_ttc=(id) · axial · 3.5mm · 0.83mm/px · 1 of 22 slices shown (20 of 22)]
[im 1/22]
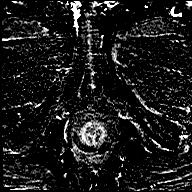

[Series 54: post t1_twist_tra_dyn-copy center · axial · 3.5mm · 0.83mm/px · 1 of 22 slices shown (22 of 24)]
[im 1/22]
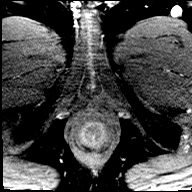

[Series 55: post t1_twist_tra_dyn-copy cent_sub_ttc=(id) · axial · 3.5mm · 0.83mm/px · 1 of 22 slices shown (21 of 22)]
[im 1/22]
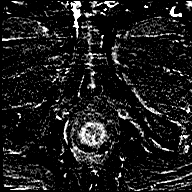

[Series 56: post t1_twist_tra_dyn-copy center · axial · 3.5mm · 0.83mm/px · 1 of 22 slices shown (23 of 24)]
[im 1/22]
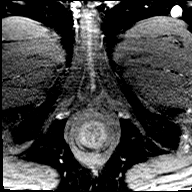

[Series 57: post t1_twist_tra_dyn-copy cent_sub_ttc=(id) · axial · 3.5mm · 0.83mm/px · 1 of 22 slices shown (22 of 22)]
[im 1/22]
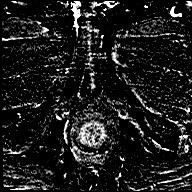

[Series 58: post t1_twist_tra_dyn-copy center · axial · 3.5mm · 0.83mm/px · 1 of 22 slices shown (24 of 24)]
[im 1/22]
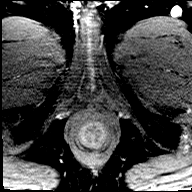

[56 of 56 positions shown; findings below may reference images not displayed]

FINDINGS: Prostate: No findings suspicious for high-grade macroscopic prostate
cancer in the peripheral gland. Specifically, no focal low T2
lesion. No restricted diffusion/low ADC. No early arterial
enhancement.

Enlargement/nodularity central gland, indenting the base of the
bladder, suggesting BPH. Associated exophytic central gland nodule
along the right posterolateral aspect of the central gland near the
base (series 7/image 7), overlying the base of the right seminal
vesicle. No suspicious central gland nodule on T2.

Volume: 3.3 x 5.1 x 4.6 cm (volume = 41 mL)

Transcapsular spread:  Absent.

Seminal vesicle involvement: Absent.

Neurovascular bundle involvement: Absent.

Pelvic adenopathy: Absent.

Bone metastasis: Absent.

Other findings: Bladder is mildly thick-walled although
underdistended. Tiny fat containing right inguinal hernia. Minimal
fat within the left inguinal canal.
IMPRESSION: No findings suspicious for high-grade macroscopic prostate cancer.

## 2022-05-11 NOTE — Progress Notes (Signed)
Sleep Medicine   Office Visit  Patient Name: Brandon [name] DOB: 04-Sep-1955 MRN [id number]    Chief Complaint: establish care for OSA   Brief History:  Brandon [name] presents for initial sleep consult with a 10 year history of OSA. Compliance shows 59% AHI 5.7 and the patient is currently on CPAP @ 8cmH20 Sleep quality is fair. This is noted most nights. The patient's bed partner reports  fighting mask at night at night. The patient relates the following symptoms: trouble staying asleep, tossing and turning are also present. The patient goes to sleep at 11pm and wakes up at 5am.  Sleep quality is worse when outside home environment.  Patient has noted restlessness of his legs at night.  The patient  relates no unusual behavior during the night.  The patient denies a history of psychiatric problems. The Epworth Sleepiness Score is 4 out of 24 .  The patient relates  Cardiovascular risk factors include: hypertension, atrial fibrillation   ROS  General: (-) fever, (-) chills, (-) night sweat Nose and Sinuses: (-) nasal stuffiness or itchiness, (-) postnasal drip, (-) nosebleeds, (-) sinus trouble. Mouth and Throat: (-) sore throat, (-) hoarseness. Neck: (-) swollen glands, (-) enlarged thyroid, (-) neck pain. Respiratory: - cough, - shortness of breath, - wheezing. Neurologic: - numbness, - tingling. Psychiatric: - anxiety, - depression Sleep behavior: -sleep paralysis -hypnogogic hallucinations -dream enactment      -vivid dreams -cataplexy -night terrors -sleep walking   Current Medication: Outpatient Encounter Medications as of 05/12/2022  Medication Sig   apixaban (ELIQUIS) 5 MG TABS tablet Take 1 tablet by mouth 2 (two) times daily.   Ascorbic Acid (VITAMIN C) 1000 MG tablet Take 1,000 mg by mouth daily.   carvedilol (COREG) 6.25 MG tablet Take 6.25 mg by mouth 2 (two) times daily.   CIALIS 5 MG tablet TAKE 1 TABLET BY MOUTH EVERY DAY AS NEEDED FOR ERECTILE DYSFUNCTION   CINNAMON PO Take by  mouth 3 (three) times daily.   glucose blood (ACCU-CHEK AVIVA PLUS) test strip USE TWICE DAILY AS DIRECTED E 11.9   hydrochlorothiazide (MICROZIDE) 12.5 MG capsule Take 12.5 mg by mouth daily.   MAGNESIUM PO Take by mouth.   metFORMIN (GLUCOPHAGE) 500 MG tablet TAKE 1 TABLET BY MOUTH TWICE DAILY WITH A MEAL   Multiple Vitamins-Minerals (MEGA BASIC PO) Take by mouth.   Omega-3 Fatty Acids (FISH OIL PO) Take by mouth daily.   rosuvastatin (CRESTOR) 5 MG tablet Take 1 tablet by mouth daily.   valsartan (DIOVAN) 160 MG tablet Take 1 tablet every day by oral route.   [DISCONTINUED] apixaban (ELIQUIS) 5 MG TABS tablet Take 1 tablet (5 mg total) by mouth 2 (two) times daily.   [DISCONTINUED] enalapril (VASOTEC) 2.5 MG tablet Take 1 tablet (2.5 mg total) by mouth daily.   [DISCONTINUED] nebivolol (BYSTOLIC) 5 MG tablet Take 1 tablet (5 mg total) by mouth daily.   [DISCONTINUED] rosuvastatin (CRESTOR) 5 MG tablet Take 1 tablet (5 mg total) by mouth at bedtime.   [DISCONTINUED] sildenafil (REVATIO) 20 MG tablet Take 1 tablet (20 mg total) by mouth 3 (three) times daily as needed.   No facility-administered encounter medications on file as of 05/12/2022.    Surgical History: Past Surgical History:  Procedure Laterality Date   APPENDECTOMY     COLONOSCOPY WITH PROPOFOL N/A 08/17/2015   Procedure: COLONOSCOPY WITH PROPOFOL;  Surgeon: Lollie Sails, MD;  Location: [facility] END[facility]COPY;  Service: Endoscopy;  Laterality: N/A;   HERNIA  REPAIR     PROSTATE BIOPSY     10/2016 negative multiple biopsies 2015 per note atypical cells unable to see this path report    Medical History: Past Medical History:  Diagnosis Date   Cancer ([place])    skin bcc face s/p mohs unc-ch   Chicken pox    Elevated PSA    follows [facility]    GERD (gastroesophageal reflux disease)    Hyperlipidemia    Hypertension    PAF (paroxysmal atrial fibrillation) ([place])    a. 11/2012-->CHA2DS2VASc = 2->Eliquis; b. 11/2012 Echo:  EF 60-65%, nl RV fxn, mild MR. Mod elev RVSP.   Sleep apnea    Type 2 diabetes mellitus ([place])     Family History: Non contributory to the present illness  Social History: Social History   Socioeconomic History   Marital status: Married    Spouse name: Not on file   Number of children: Not on file   Years of education: Not on file   Highest education level: Not on file  Occupational History   Not on file  Tobacco Use   Smoking status: Never   Smokeless tobacco: Never  Vaping Use   Vaping Use: Never used  Substance and Sexual Activity   Alcohol use: Yes    Alcohol/week: 5.0 standard drinks of alcohol    Types: 5 Cans of beer per week    Comment: social drinker   Drug use: No   Sexual activity: Yes    Partners: Female  Other Topics Concern   Not on file  Social History Narrative   Married    Works in Scientist, product/process development, 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 rate 16, height 6' (1.829 m), weight 234 lb (106.1 kg), SpO2 95 %. Body mass index is 31.74 kg/m.   Examination: General Appearance: The patient is well-developed, well-nourished, and in no distress. Neck Circumference: 44cm Skin: Gross inspection of skin unremarkable. Head: normocephalic, no gross deformities. Eyes: no gross deformities noted. ENT: ears appear grossly normal Neurologic: Alert and oriented. No involuntary movements.    STOP BANG RISK ASSESSMENT S (snore) Have you been told that you snore?     NO   T (tired) Are you often tired, fatigued, or sleepy during the day?   YES  O (obstruction) Do you stop breathing, choke,  or gasp during sleep? NO   P (pressure) Do you have or are you being treated for high blood pressure? YES   B (BMI) Is your body index greater than 35 kg/m? YES   A (age) Are you 13 years old or older? YES   N (neck) Do you have a neck circumference greater than 16 inches?   YES   G (gender) Are you a male? YES   TOTAL STOP/BANG "YES" ANSWERS 6  A STOP-Bang score of 2 or less is considered low risk, and a score of 5 or more is high risk for having either moderate or severe OSA. For people who score 3 or 4, doctors may need to perform further assessment to determine how likely they are to have OSA.         EPWORTH SLEEPINESS SCALE:  Scale:  (0)= no chance of dozing; (1)= slight chance of dozing; (2)= moderate chance of dozing; (3)= high chance of dozing  Chance  Situtation    Sitting and reading: 0    Watching TV: 1    Sitting Inactive in public: 0    As a passenger in car: 0      Lying down to rest: 2    Sitting and talking: 0    Sitting quielty after lunch: 1    In a car, stopped in traffic: 0   TOTAL SCORE:   4 out of 24    SLEEP STUDIES:  Split study 12/16/2012 AHI 28/hr Supine 71 hr, o2 desat to 89% with low of 82%   LABS: No results found for this or any previous visit (from the past 2160 hour(s)).  Radiology: MR PROSTATE W WO CONTRAST  Result Date: 03/22/2018 CLINICAL DATA:  Elevated PSA, negative biopsy EXAM: MR PROSTATE WITHOUT AND WITH CONTRAST TECHNIQUE: Multiplanar multisequence MRI images were obtained of the pelvis centered about the prostate. Pre and post contrast images were obtained. CONTRAST:  29m MULTIHANCE GADOBENATE DIMEGLUMINE 529 MG/ML IV SOLN COMPARISON:  None. FINDINGS: Prostate: No findings suspicious for high-grade macroscopic prostate cancer in the peripheral gland. Specifically, no focal low T2 lesion. No restricted diffusion/low ADC. No early arterial enhancement.  Enlargement/nodularity central gland, indenting the base of the bladder, suggesting BPH. Associated exophytic central gland nodule along the right posterolateral aspect of the central gland near the base (series 7/image 7), overlying the base of the right seminal vesicle. No suspicious central gland nodule on T2. Volume: 3.3 x 5.1 x 4.6 cm (volume = 41 mL) Transcapsular spread:  Absent. Seminal vesicle involvement: Absent. Neurovascular bundle involvement: Absent. Pelvic adenopathy: Absent. Bone metastasis: Absent. Other findings: Bladder is mildly thick-walled although underdistended. Tiny fat containing right inguinal hernia. Minimal fat within the left inguinal canal. IMPRESSION: No findings suspicious for high-grade macroscopic prostate cancer. Electronically Signed   By: SJulian HyM.D.   On: 03/22/2018 19:32    No results found.  No results found.    Assessment and Plan: Patient Active Problem List   Diagnosis Date Noted   Hypercholesterolemia 05/12/2022   OSA (obstructive sleep apnea) 05/12/2022   CPAP use counseling 05/12/2022   Raised prostate specific antigen 11/25/2019   BPH (benign prostatic hyperplasia) 07/17/2018   History of skin cancer 02/15/2018   AK (actinic keratosis) 08/15/2017   History of basal cell carcinoma 08/15/2017   Vision abnormalities 03/14/2017   Elevated PSA 03/14/2017   Hyperlipidemia 08/28/2015   Dizziness 02/14/2014   Acid reflux 08/01/2013   Essential hypertension 08/01/2013   Benign essential hypertension 08/01/2013   Erectile dysfunction associated with type 2 diabetes mellitus ([place] 05/31/2013   Type 2 diabetes mellitus ([place] 05/31/2013   [place]-fib ([place] 12/05/2012   Obesity 12/05/2012   Alcohol abuse 12/05/2012   Sleep apnea 12/05/2012   DM (diabetes mellitus), type 2 ([place] 12/05/2012   Atrial fibrillation ([place] 12/05/2012   1. OSA (obstructive sleep apnea) Pt is struggling with mask. We will set him up for a mask fitting. His apnea is  not optimally controlled, we will increase his pressure to 9 cm. He will f/u in 79m 2. CPAP use counseling CPAP Counseling: had a lengthy discussion with the patient regarding the importance of PAP therapy in management of the sleep apnea. Patient appears to understand the risk factor reduction and also understands the risks associated with untreated sleep apnea. Patient will try to make a good faith effort to remain compliant with therapy. Also instructed the patient on proper cleaning of the device including the water must be changed daily if possible and use of distilled water is preferred. Patient understands that the machine should be regularly cleaned with appropriate recommended cleaning solutions that do not damage the PAP machine for example given white vinegar and water rinses. Other methods such as ozone treatment may not be as good as these simple methods to achieve cleaning.   3. Benign essential hypertension Hypertension Counseling:   The following hypertensive lifestyle modification were recommended and discussed:  1. Limiting alcohol intake to less than 1 oz/day of ethanol:(24 oz of beer or 8 oz of wine or 2 oz of 100-proof whiskey). 2. Take baby ASA 81 mg daily. 3. Importance of regular aerobic exercise and losing weight. 4. Reduce dietary saturated fat and cholesterol intake for overall cardiovascular health. 5. Maintaining adequate dietary potassium, calcium, and magnesium intake. 6. Regular monitoring of the blood pressure. 7. Reduce sodium intake to less than 100 mmol/day (less than 2.3 gm of sodium or less than 6 gm of sodium choride)    4. Atrial fibrillation, unspecified type ([place] Pt understands connection with OSA. Continue with eliquis and f/u with cardiologist.       General Counseling: I have discussed the findings of the evaluation and examination with JUlice Dash  I have also discussed any further diagnostic evaluation thatmay be needed or ordered today. JRicahrdverbalizes  understanding of the findings of todays visit. We also reviewed his medications today and discussed drug interactions and side effects including but not limited excessive drowsiness and altered mental states. We also discussed that there is always a risk not just to him but also people around him. he has been encouraged to call the office with any questions or concerns that should arise related to todays visit.  No orders of the defined types were placed in this encounter.       I have personally obtained a history, evaluated the patient, evaluated pertinent data, formulated the assessment and plan and placed orders.   This patient was seen today by STressie Ellis PA-C in collaboration with Dr. SDevona Konig   SAllyne Gee MD FHaxtun Hospital DistrictDiplomate ABMS Pulmonary and Critical Care Medicine Sleep medicine

## 2022-05-12 ENCOUNTER — Ambulatory Visit (INDEPENDENT_AMBULATORY_CARE_PROVIDER_SITE_OTHER): Payer: BC Managed Care – PPO | Admitting: Internal Medicine

## 2022-05-12 VITALS — BP 150/88 | HR 72 | Resp 16 | Ht 72.0 in | Wt 234.0 lb

## 2022-05-12 DIAGNOSIS — E7800 Pure hypercholesterolemia, unspecified: Secondary | ICD-10-CM | POA: Insufficient documentation

## 2022-05-12 DIAGNOSIS — Z7189 Other specified counseling: Secondary | ICD-10-CM | POA: Diagnosis not present

## 2022-05-12 DIAGNOSIS — G4733 Obstructive sleep apnea (adult) (pediatric): Secondary | ICD-10-CM

## 2022-05-12 DIAGNOSIS — I4891 Unspecified atrial fibrillation: Secondary | ICD-10-CM

## 2022-05-12 DIAGNOSIS — I10 Essential (primary) hypertension: Secondary | ICD-10-CM

## 2022-05-12 NOTE — Patient Instructions (Signed)

## 2022-08-10 NOTE — Progress Notes (Unsigned)
[facility] [address] [place], [place] [address]  Pulmonary Sleep Medicine   Office Visit Note  Patient Name: Brandon [name] DOB: 1955/11/02 MRN [id number]    Chief Complaint: Obstructive Sleep Apnea visit  Brief History:  Jalani is seen today for 3 month follow up on PAP The patient has a 10 year history of sleep apnea. Patient is using PAP nightly.  The patient feels un rested after sleeping with PAP.  His machine is past end of life and needs to be replaced. Prior to going on cpap the patient had daytime sleepiness and snoring, choking, gasping, and witnessed apnea. The patient reports benefit  from PAP use. Reported sleepiness is  improved and the Epworth Sleepiness Score is 1 out of 24. The patient doesn't  take naps. The patient complains of the following: legs are bothering him.  The compliance download shows 98%  compliance with an average use time of 6 hours. The AHI is 8.8 The patient does complain  of limb movements disrupting sleep.  ROS  General: (-) fever, (-) chills, (-) night sweat Nose and Sinuses: (-) nasal stuffiness or itchiness, (-) postnasal drip, (-) nosebleeds, (-) sinus trouble. Mouth and Throat: (-) sore throat, (-) hoarseness. Neck: (-) swollen glands, (-) enlarged thyroid, (-) neck pain. Respiratory: - cough, - shortness of breath, - wheezing. Neurologic: - numbness, - tingling. Psychiatric: - anxiety, - depression   Current Medication: Outpatient Encounter Medications as of 08/12/2022  Medication Sig   apixaban (ELIQUIS) 5 MG TABS tablet Take 1 tablet by mouth 2 (two) times daily.   Ascorbic Acid (VITAMIN C) 1000 MG tablet Take 1,000 mg by mouth daily.   carvedilol (COREG) 6.25 MG tablet Take 6.25 mg by mouth 2 (two) times daily.   CIALIS 5 MG tablet TAKE 1 TABLET BY MOUTH EVERY DAY AS NEEDED FOR ERECTILE DYSFUNCTION   CINNAMON PO Take by mouth 3 (three) times daily.   glucose blood (ACCU-CHEK AVIVA PLUS) test strip USE TWICE DAILY AS  DIRECTED E 11.9   hydrochlorothiazide (MICROZIDE) 12.5 MG capsule Take 12.5 mg by mouth daily.   MAGNESIUM PO Take by mouth.   metFORMIN (GLUCOPHAGE) 500 MG tablet TAKE 1 TABLET BY MOUTH TWICE DAILY WITH A MEAL   Multiple Vitamins-Minerals (MEGA BASIC PO) Take by mouth.   Omega-3 Fatty Acids (FISH OIL PO) Take by mouth daily.   rosuvastatin (CRESTOR) 5 MG tablet Take 1 tablet by mouth daily.   valsartan (DIOVAN) 160 MG tablet Take 1 tablet every day by oral route.   No facility-administered encounter medications on file as of 08/12/2022.    Surgical History: Past Surgical History:  Procedure Laterality Date   APPENDECTOMY     COLONOSCOPY WITH PROPOFOL N/A 08/17/2015   Procedure: COLONOSCOPY WITH PROPOFOL;  Surgeon: Christena Deem, MD;  Location: [facility] ENDOSCOPY;  Service: Endoscopy;  Laterality: N/A;   HERNIA REPAIR     PROSTATE BIOPSY     10/2016 negative multiple biopsies 2015 per note atypical cells unable to see this path report    Medical History: Past Medical History:  Diagnosis Date   Cancer (HCC)    skin bcc face s/p [facility]   Chicken pox    Elevated PSA    follows [facility]    GERD (gastroesophageal reflux disease)    Hyperlipidemia    Hypertension    PAF (paroxysmal atrial fibrillation) (HCC)    a. 11/2012-->CHA2DS2VASc = 2->Eliquis; b. 11/2012 Echo: EF 60-65%, nl RV fxn, mild MR.  Mod elev RVSP.   Sleep apnea    Type 2 diabetes mellitus (HCC)     Family History: Non contributory to the present illness  Social History: Social History   Socioeconomic History   Marital status: Married    Spouse name: Not on file   Number of children: Not on file   Years of education: Not on file   Highest education level: Not on file  Occupational History   Not on file  Tobacco Use   Smoking status: Never   Smokeless tobacco: Never  Vaping Use   Vaping Use: Never used  Substance and Sexual Activity   Alcohol use: Yes    Alcohol/week: 5.0 standard drinks of  alcohol    Types: 5 Cans of beer per week    Comment: social drinker   Drug use: No   Sexual activity: Yes    Partners: Female  Other Topics Concern   Not on file  Social History Narrative   Married    Works in Physicist, medical, 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 rate 16, height 6' (1.829 m), weight 236 lb (107 kg), SpO2 98 %. Body mass index is 32.01 kg/m.    Examination: General Appearance: The patient is well-developed, well-nourished, and in no distress. Neck Circumference: 47cm Skin: Gross inspection of skin unremarkable. Head: normocephalic, no gross deformities. Eyes: no gross deformities noted. ENT: ears appear grossly normal Neurologic: Alert and oriented. No involuntary movements.  STOP BANG RISK ASSESSMENT S (snore) Have you been told that you snore?     NO   T (tired) Are you often tired, fatigued, or sleepy during the day?   YES  O (obstruction) Do you stop breathing, choke, or gasp during sleep? NO   P (pressure) Do you have or are you being treated for high blood pressure? YES   B (BMI) Is your body index greater than 35 kg/m? YES   A (age) Are you 57 years old or older? YES   N (neck) Do you have a neck circumference greater than 16 inches?   YES   G (gender) Are you a male? YES   TOTAL STOP/BANG "YES" ANSWERS 6       A STOP-Bang score of 2 or less is considered low risk, and a score of 5 or more is high risk for having either moderate or severe OSA. For people who score 3 or 4, doctors may need to perform further assessment to determine how likely they are to have OSA.          EPWORTH SLEEPINESS SCALE:  Scale:  (0)= no chance of dozing; (1)= slight chance of dozing; (2)= moderate chance of dozing; (3)= high chance of dozing  Chance  Situtation    Sitting and reading: 0    Watching TV: 1    Sitting Inactive in public: 0    As a passenger in car: 0      Lying down to rest: 0    Sitting and talking: 0    Sitting quielty after lunch: 0  In a car, stopped in traffic: 0   TOTAL SCORE:   1 out of 24    SLEEP STUDIES:  Split study 12/16/2012 AHI 28/hr Supine 71 hr, o2 desat to 89% with low of 82%    CPAP COMPLIANCE DATA:  Date Range: 05/14/22  Average Daily Use: 6 hours 10 mins  Median Use: 6 hrs 16 mins   Compliance for > 4 Hours: 98% days  AHI:8.6 respiratory events per hour  Days Used: 90/90  Mask Leak: 50.6  95th Percentile Pressure: 8         LABS: No results found for this or any previous visit (from the past 2160 hour(s)).  Radiology: MR PROSTATE W WO CONTRAST  Result Date: 03/22/2018 CLINICAL DATA:  Elevated PSA, negative biopsy EXAM: MR PROSTATE WITHOUT AND WITH CONTRAST TECHNIQUE: Multiplanar multisequence MRI images were obtained of the pelvis centered about the prostate. Pre and post contrast images were obtained. CONTRAST:  20mL MULTIHANCE GADOBENATE DIMEGLUMINE 529 MG/ML IV SOLN COMPARISON:  None. FINDINGS: Prostate: No findings suspicious for high-grade macroscopic prostate cancer in the peripheral gland. Specifically, no focal low T2 lesion. No restricted diffusion/low ADC. No early arterial enhancement. Enlargement/nodularity central gland, indenting the base of the bladder, suggesting BPH. Associated exophytic central gland nodule along the right posterolateral aspect of the central gland near the base (series 7/image 7), overlying the base of the right seminal vesicle. No suspicious central gland nodule on T2. Volume: 3.3 x 5.1 x 4.6 cm (volume = 41 mL) Transcapsular spread:  Absent. Seminal vesicle  involvement: Absent. Neurovascular bundle involvement: Absent. Pelvic adenopathy: Absent. Bone metastasis: Absent. Other findings: Bladder is mildly thick-walled although underdistended. Tiny fat containing right inguinal hernia. Minimal fat within the left inguinal canal. IMPRESSION: No findings suspicious for high-grade macroscopic prostate cancer. Electronically Signed   By: Charline Bills M.D.   On: 03/22/2018 19:32    No results found.  No results found.    Assessment and Plan: Patient Active Problem List   Diagnosis Date Noted   Restless leg syndrome 08/12/2022   Hypercholesterolemia 05/12/2022   OSA (obstructive sleep apnea) 05/12/2022   CPAP use counseling 05/12/2022   Raised prostate specific antigen 11/25/2019   BPH (benign prostatic hyperplasia) 07/17/2018   History of skin cancer 02/15/2018   AK (actinic keratosis) 08/15/2017   History of basal cell carcinoma 08/15/2017   Vision abnormalities 03/14/2017   Elevated PSA 03/14/2017   Hyperlipidemia 08/28/2015   Dizziness 02/14/2014   Acid reflux 08/01/2013   Essential hypertension 08/01/2013   Benign essential hypertension 08/01/2013   Erectile dysfunction associated with type 2 diabetes mellitus (HCC) 05/31/2013   Type 2 diabetes mellitus (HCC) 05/31/2013   A-fib (HCC) 12/05/2012   Obesity 12/05/2012   Alcohol abuse 12/05/2012   Sleep apnea 12/05/2012   DM (diabetes mellitus), type 2 (HCC) 12/05/2012   Atrial fibrillation (HCC) 12/05/2012   1. OSA (obstructive sleep apnea) The patient is struggling to  tolerate PAP and reports minimal benefit from PAP use. The patient was reminded how to clean equipment and advised to replace supplies routinely. He is in need of a new machine, he has significant leak. We will adjust his pressure to apap 6-12 to see if this improves his apnea control. The patient was also counselled on weight loss. The compliance is excellent. The AHI is 8.6, likely due to the leak   OSA on cpap-  change to apap, new machine. F/u post set up  2. CPAP use counseling CPAP Counseling:  had a lengthy discussion with the patient regarding the importance of PAP therapy in management of the sleep apnea. Patient appears to understand the risk factor reduction and also understands the risks associated with untreated sleep apnea. Patient will try to make a good faith effort to remain compliant with therapy. Also instructed the patient on proper cleaning of the device including the water must be changed daily if possible and use of distilled water is preferred. Patient understands that the machine should be regularly cleaned with appropriate recommended cleaning solutions that do not damage the PAP machine for example given white vinegar and water rinses. Other methods such as ozone treatment may not be as good as these simple methods to achieve cleaning.   3. Benign essential hypertension Hypertension Counseling:   The following hypertensive lifestyle modification were recommended and discussed:  1. Limiting alcohol intake to less than 1 oz/day of ethanol:(24 oz of beer or 8 oz of wine or 2 oz of 100-proof whiskey). 2. Take baby ASA 81 mg daily. 3. Importance of regular aerobic exercise and losing weight. 4. Reduce dietary saturated fat and cholesterol intake for overall cardiovascular health. 5. Maintaining adequate dietary potassium, calcium, and magnesium intake. 6. Regular monitoring of the blood pressure. 7. Reduce sodium intake to less than 100 mmol/day (less than 2.3 gm of sodium or less than 6 gm of sodium choride)    4. Atrial fibrillation, unspecified type (HCC) On eliquis. Continue  5. Restless leg syndrome He will try theanine. He prefers to avoid prescription medications.      General Counseling: I have discussed the findings of the evaluation and examination with Leshaun.  I have also discussed any further diagnostic evaluation thatmay be needed or ordered today. Truston verbalizes  understanding of the findings of todays visit. We also reviewed his medications today and discussed drug interactions and side effects including but not limited excessive drowsiness and altered mental states. We also discussed that there is always a risk not just to him but also people around him. he has been encouraged to call the office with any questions or concerns that should arise related to todays visit.  No orders of the defined types were placed in this encounter.       I have personally obtained a history, examined the patient, evaluated laboratory and imaging results, formulated the assessment and plan and placed orders. This patient was seen today by Emmaline Kluver, PA-C in collaboration with Dr. Freda Munro.   Yevonne Pax, MD [facility] Diplomate ABMS Pulmonary Critical Care Medicine and Sleep Medicine

## 2022-08-12 ENCOUNTER — Ambulatory Visit (INDEPENDENT_AMBULATORY_CARE_PROVIDER_SITE_OTHER): Payer: Medicare Other | Admitting: Internal Medicine

## 2022-08-12 VITALS — BP 138/86 | HR 66 | Resp 16 | Ht 72.0 in | Wt 236.0 lb

## 2022-08-12 DIAGNOSIS — I4891 Unspecified atrial fibrillation: Secondary | ICD-10-CM | POA: Diagnosis not present

## 2022-08-12 DIAGNOSIS — G4733 Obstructive sleep apnea (adult) (pediatric): Secondary | ICD-10-CM

## 2022-08-12 DIAGNOSIS — G2581 Restless legs syndrome: Secondary | ICD-10-CM

## 2022-08-12 DIAGNOSIS — Z7189 Other specified counseling: Secondary | ICD-10-CM

## 2022-08-12 DIAGNOSIS — I10 Essential (primary) hypertension: Secondary | ICD-10-CM

## 2022-08-12 NOTE — Patient Instructions (Signed)

## 2022-11-02 NOTE — Progress Notes (Signed)
[facility] [address] [place], [place] [address]  Pulmonary Sleep Medicine   Office Visit Note  Patient Name: Brandon [name] DOB: [birth year] MRN [id number]    Chief Complaint: Obstructive Sleep Apnea visit  Brief History:  Brandon [name] is seen today for follow up on APAP 6-12cmH20 The patient has a 6 month history of sleep apnea. Patient is using PAP nightly.  The patient feels somewhat rested after sleeping with PAP.  The patient reports benefit from PAP use. Reported sleepiness is  improved and the Epworth Sleepiness Score is 3 out of 24. The patient does take naps. 3 time a week lasting 20 mins. The patient complains of the following: none at this time The compliance download shows 100% compliance with an average use time of 6.5 hours. The AHI is 5.9  The patient does complain of limb movements disrupting sleep.  ROS  General: (-) fever, (-) chills, (-) night sweat Nose and Sinuses: (-) nasal stuffiness or itchiness, (-) postnasal drip, (-) nosebleeds, (-) sinus trouble. Mouth and Throat: (-) sore throat, (-) hoarseness. Neck: (-) swollen glands, (-) enlarged thyroid, (-) neck pain. Respiratory: - cough, - shortness of breath, - wheezing. Neurologic: - numbness, - tingling. Psychiatric: - anxiety, - depression   Current Medication: Outpatient Encounter Medications as of 11/04/2022  Medication Sig   apixaban (ELIQUIS) 5 MG TABS tablet Take 1 tablet by mouth 2 (two) times daily.   Ascorbic Acid (VITAMIN C) 1000 MG tablet Take 1,000 mg by mouth daily.   carvedilol (COREG) 6.25 MG tablet Take 6.25 mg by mouth 2 (two) times daily.   CIALIS 5 MG tablet TAKE 1 TABLET BY MOUTH EVERY DAY AS NEEDED FOR ERECTILE DYSFUNCTION   CINNAMON PO Take by mouth 3 (three) times daily.   glucose blood (ACCU-CHEK AVIVA PLUS) test strip USE TWICE DAILY AS DIRECTED E 11.9   hydrochlorothiazide (MICROZIDE) 12.5 MG capsule Take 12.5 mg by mouth daily.   MAGNESIUM PO Take by mouth.   metFORMIN  (GLUCOPHAGE) 500 MG tablet TAKE 1 TABLET BY MOUTH TWICE DAILY WITH A MEAL   Multiple Vitamins-Minerals (MEGA BASIC PO) Take by mouth.   Omega-3 Fatty Acids (FISH OIL PO) Take by mouth daily.   rosuvastatin (CRESTOR) 5 MG tablet Take 1 tablet by mouth daily.   valsartan (DIOVAN) 160 MG tablet Take 1 tablet every day by oral route.   No facility-administered encounter medications on file as of 11/04/2022.    Surgical History: Past Surgical History:  Procedure Laterality Date   APPENDECTOMY     COLONOSCOPY WITH PROPOFOL N/A 08/17/2015   Procedure: COLONOSCOPY WITH PROPOFOL;  Surgeon: Christena Deem, MD;  Location: [facility] ENDOSCOPY;  Service: Endoscopy;  Laterality: N/A;   HERNIA REPAIR     PROSTATE BIOPSY     10/2016 negative multiple biopsies 2015 per note atypical cells unable to see this path report    Medical History: Past Medical History:  Diagnosis Date   Cancer (HCC)    skin bcc face s/p mohs unc-ch   Chicken pox    Elevated PSA    follows [facility]    GERD (gastroesophageal reflux disease)    Hyperlipidemia    Hypertension    PAF (paroxysmal atrial fibrillation) (HCC)    a. 11/2012-->CHA2DS2VASc = 2->Eliquis; b. 11/2012 Echo: EF 60-65%, nl RV fxn, mild MR. Mod elev RVSP.   Sleep apnea    Type 2 diabetes mellitus (HCC)     Family History: Non contributory to the present illness  Social History: Social History   Socioeconomic History   Marital status: Married    Spouse name: Not on file   Number of children: Not on file   Years of education: Not on file   Highest education level: Not on file  Occupational History   Not on file  Tobacco Use   Smoking status: Never   Smokeless tobacco: Never  Vaping Use   Vaping status: Never Used  Substance and Sexual Activity   Alcohol use: Yes    Alcohol/week: 5.0 standard drinks of alcohol    Types: 5 Cans of beer per week    Comment: social drinker   Drug use: No   Sexual activity: Yes    Partners: Female   Other Topics Concern   Not on file  Social History Narrative   Married    Works in Physicist, medical, 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 rate 16, height 6' (1.829 m), weight 232 lb (105.2 kg), SpO2 96%. Body mass index is 31.46 kg/m.    Examination: General Appearance: The patient is well-developed, well-nourished, and in no distress. Neck Circumference: 46cm Skin: Gross inspection of skin unremarkable. Head: normocephalic, no gross deformities. Eyes: no gross deformities noted. ENT: ears appear grossly normal Neurologic: Alert and oriented. No involuntary movements.  STOP BANG RISK ASSESSMENT S (snore) Have you been told that you snore?     NO   T (tired) Are you often tired, fatigued, or sleepy during the day?   NO  O (obstruction) Do you stop breathing, choke, or gasp during sleep? YES   P (pressure) Do you have or are you being treated for high blood pressure? YES   B (BMI) Is your body index greater than 35 kg/m? YES   A (age) Are you 42 years old or older? YES   N (neck) Do you have a neck circumference greater than 16 inches?   YES   G (gender) Are you a male? YES   TOTAL STOP/BANG "YES" ANSWERS 5       A STOP-Bang score of 2 or less is considered low risk, and a score of 5 or more is high risk for having either moderate or severe OSA. For people who score 3 or 4, doctors may need to perform further assessment to determine how likely they are to have OSA.         EPWORTH SLEEPINESS SCALE:  Scale:  (0)= no chance of dozing; (1)= slight chance of dozing; (2)= moderate chance of dozing;  (3)= high chance of dozing  Chance  Situtation    Sitting and reading: 1    Watching TV: 1    Sitting Inactive in public: 0    As a passenger in car: 0      Lying down to rest: 1    Sitting and talking: 0    Sitting quielty after lunch: 0    In a car, stopped in traffic: 0   TOTAL SCORE:   3 out of 24    SLEEP STUDIES:  Split study 12/16/2012 AHI 28/hr Supine  71 hr, o2 desat to 89% with low of 82%    CPAP COMPLIANCE DATA:  Date Range: 10/03/22-11/01/22  Average Daily Use: 6 hours 31 mins   Median Use: 6 hrs 31 mins   Compliance for > 4 Hours: 100% days  AHI: 5.9 respiratory events per hour  Days Used: 30/30  Mask Leak: 93.8  95th Percentile Pressure: 11.3         LABS: No results found for this or any previous visit (from the past 2160 hour(s)).  Radiology: MR PROSTATE W WO CONTRAST  Result Date: 03/22/2018 CLINICAL DATA:  Elevated PSA, negative biopsy EXAM: MR PROSTATE WITHOUT AND WITH CONTRAST TECHNIQUE: Multiplanar multisequence MRI images were obtained of the pelvis centered about the prostate. Pre and post contrast images were obtained. CONTRAST:  20mL MULTIHANCE GADOBENATE DIMEGLUMINE 529 MG/ML IV SOLN COMPARISON:  None. FINDINGS: Prostate: No findings suspicious for high-grade macroscopic prostate cancer in the peripheral gland. Specifically, no focal low T2 lesion. No restricted diffusion/low ADC. No early arterial enhancement. Enlargement/nodularity central gland, indenting the base of the bladder, suggesting BPH. Associated exophytic central gland nodule along the right posterolateral aspect of the central gland near the base (series 7/image 7), overlying the base of the right seminal vesicle. No suspicious central gland nodule on T2. Volume: 3.3 x 5.1 x 4.6 cm (volume = 41 mL) Transcapsular spread:  Absent. Seminal vesicle involvement: Absent. Neurovascular bundle involvement: Absent. Pelvic adenopathy: Absent. Bone metastasis: Absent. Other  findings: Bladder is mildly thick-walled although underdistended. Tiny fat containing right inguinal hernia. Minimal fat within the left inguinal canal. IMPRESSION: No findings suspicious for high-grade macroscopic prostate cancer. Electronically Signed   By: Charline Bills M.D.   On: 03/22/2018 19:32    No results found.  No results found.    Assessment and Plan: Patient Active Problem List   Diagnosis Date Noted   Restless leg syndrome 08/12/2022   Hypercholesterolemia 05/12/2022   OSA (obstructive sleep apnea) 05/12/2022   CPAP use counseling 05/12/2022   Raised prostate specific antigen 11/25/2019   BPH (benign prostatic hyperplasia) 07/17/2018   History of skin cancer 02/15/2018   AK (actinic keratosis) 08/15/2017   History of basal cell carcinoma 08/15/2017   Vision abnormalities 03/14/2017   Elevated PSA 03/14/2017   Hyperlipidemia 08/28/2015   Dizziness 02/14/2014   Acid reflux 08/01/2013   Essential hypertension 08/01/2013   Benign essential hypertension 08/01/2013   Erectile dysfunction associated with type 2 diabetes mellitus (HCC) 05/31/2013   Type 2 diabetes mellitus (HCC) 05/31/2013   A-fib (HCC) 12/05/2012   Obesity 12/05/2012   Alcohol abuse 12/05/2012   Sleep apnea 12/05/2012   DM (diabetes mellitus), type 2 (HCC) 12/05/2012   Atrial fibrillation (HCC) 12/05/2012   1. OSA (obstructive sleep apnea) The patient does tolerate PAP and reports  benefit from PAP use. He has significant mask leak and he will change masks to address this. The patient was reminded how to clean equipment and advised to replace supplies routinely. The patient was also counselled on weight loss. The compliance is excellent. The AHI is 5.9, elevated likely due to mask leak and restlessness.   OSA on cpap- borderline controlled. Continue with excellent compliance with pap. Mask change. F/u 94m. We could consider treating his restless legs if elevated AHI persists.    2. CPAP use  counseling CPAP Counseling: had a lengthy discussion with the patient regarding the importance of PAP therapy in management of the sleep apnea. Patient appears to understand the risk factor  reduction and also understands the risks associated with untreated sleep apnea. Patient will try to make a good faith effort to remain compliant with therapy. Also instructed the patient on proper cleaning of the device including the water must be changed daily if possible and use of distilled water is preferred. Patient understands that the machine should be regularly cleaned with appropriate recommended cleaning solutions that do not damage the PAP machine for example given white vinegar and water rinses. Other methods such as ozone treatment may not be as good as these simple methods to achieve cleaning.   3. Benign essential hypertension Hypertension Counseling:   The following hypertensive lifestyle modification were recommended and discussed:  1. Limiting alcohol intake to less than 1 oz/day of ethanol:(24 oz of beer or 8 oz of wine or 2 oz of 100-proof whiskey). 2. Take baby ASA 81 mg daily. 3. Importance of regular aerobic exercise and losing weight. 4. Reduce dietary saturated fat and cholesterol intake for overall cardiovascular health. 5. Maintaining adequate dietary potassium, calcium, and magnesium intake. 6. Regular monitoring of the blood pressure. 7. Reduce sodium intake to less than 100 mmol/day (less than 2.3 gm of sodium or less than 6 gm of sodium choride)         General Counseling: I have discussed the findings of the evaluation and examination with Brandon [name].  I have also discussed any further diagnostic evaluation thatmay be needed or ordered today. Brandon [name] verbalizes understanding of the findings of todays visit. We also reviewed his medications today and discussed drug interactions and side effects including but not limited excessive drowsiness and altered mental states. We also discussed that  there is always a risk not just to him but also people around him. he has been encouraged to call the office with any questions or concerns that should arise related to todays visit.  No orders of the defined types were placed in this encounter.       I have personally obtained a history, examined the patient, evaluated laboratory and imaging results, formulated the assessment and plan and placed orders. This patient was seen today by Emmaline Kluver, PA-C in collaboration with Dr. Freda Munro.   Yevonne Pax, MD [facility] Diplomate ABMS Pulmonary Critical Care Medicine and Sleep Medicine

## 2022-11-04 ENCOUNTER — Ambulatory Visit (INDEPENDENT_AMBULATORY_CARE_PROVIDER_SITE_OTHER): Payer: Medicare Other | Admitting: Internal Medicine

## 2022-11-04 VITALS — BP 134/86 | HR 74 | Resp 16 | Ht 72.0 in | Wt 232.0 lb

## 2022-11-04 DIAGNOSIS — Z7189 Other specified counseling: Secondary | ICD-10-CM

## 2022-11-04 DIAGNOSIS — G4733 Obstructive sleep apnea (adult) (pediatric): Secondary | ICD-10-CM

## 2022-11-04 DIAGNOSIS — I10 Essential (primary) hypertension: Secondary | ICD-10-CM | POA: Diagnosis not present

## 2022-11-04 NOTE — Patient Instructions (Signed)

## 2023-02-10 ENCOUNTER — Ambulatory Visit (INDEPENDENT_AMBULATORY_CARE_PROVIDER_SITE_OTHER): Payer: Medicare Other | Admitting: Internal Medicine

## 2023-02-10 VITALS — BP 132/80 | HR 60 | Resp 16 | Ht 72.0 in | Wt 234.0 lb

## 2023-02-10 DIAGNOSIS — I10 Essential (primary) hypertension: Secondary | ICD-10-CM | POA: Diagnosis not present

## 2023-02-10 DIAGNOSIS — Z7189 Other specified counseling: Secondary | ICD-10-CM

## 2023-02-10 DIAGNOSIS — G4733 Obstructive sleep apnea (adult) (pediatric): Secondary | ICD-10-CM

## 2023-02-10 DIAGNOSIS — I4891 Unspecified atrial fibrillation: Secondary | ICD-10-CM | POA: Diagnosis not present

## 2023-02-10 NOTE — Progress Notes (Unsigned)
[facility] [address] [place], [place] [address]  Pulmonary Sleep Medicine   Office Visit Note  Patient Name: Brandon [name] DOB: 06-04-1955 MRN [id number]    Chief Complaint: Obstructive Sleep Apnea visit  Brief History:  Brandon [name] is seen today for 3 month follow up on PAP  The patient has a 10 year history of sleep apnea. Patient is using PAP nightly.  The patient feels un rested after sleeping with PAP.  The patient reports benefit  from PAP use. Reported sleepiness is  the same and the Epworth Sleepiness Score is 3 out of 24. The patient does take naps 2 times a week last 20 mins . The patient complains of the following: none at this time  The compliance download shows 100% compliance with an average use time of 7 hours. The AHI is 5.0  The patient does complain of limb movements disrupting sleep.  ROS  General: (-) fever, (-) chills, (-) night sweat Nose and Sinuses: (-) nasal stuffiness or itchiness, (-) postnasal drip, (-) nosebleeds, (-) sinus trouble. Mouth and Throat: (-) sore throat, (-) hoarseness. Neck: (-) swollen glands, (-) enlarged thyroid, (-) neck pain. Respiratory: - cough, - shortness of breath, - wheezing. Neurologic: - numbness, - tingling. Psychiatric: - anxiety, - depression   Current Medication: Outpatient Encounter Medications as of 02/10/2023  Medication Sig   apixaban (ELIQUIS) 5 MG TABS tablet Take 1 tablet by mouth 2 (two) times daily.   Ascorbic Acid (VITAMIN C) 1000 MG tablet Take 1,000 mg by mouth daily.   carvedilol (COREG) 6.25 MG tablet Take 6.25 mg by mouth 2 (two) times daily.   CIALIS 5 MG tablet TAKE 1 TABLET BY MOUTH EVERY DAY AS NEEDED FOR ERECTILE DYSFUNCTION   CINNAMON PO Take by mouth 3 (three) times daily.   glucose blood (ACCU-CHEK AVIVA PLUS) test strip USE TWICE DAILY AS DIRECTED E 11.9   hydrochlorothiazide (MICROZIDE) 12.5 MG capsule Take 12.5 mg by mouth daily.   MAGNESIUM PO Take by mouth.   metFORMIN (GLUCOPHAGE)  500 MG tablet TAKE 1 TABLET BY MOUTH TWICE DAILY WITH A MEAL   Multiple Vitamins-Minerals (MEGA BASIC PO) Take by mouth.   Omega-3 Fatty Acids (FISH OIL PO) Take by mouth daily.   rosuvastatin (CRESTOR) 5 MG tablet Take 1 tablet by mouth daily.   valsartan (DIOVAN) 160 MG tablet Take 1 tablet every day by oral route.   No facility-administered encounter medications on file as of 02/10/2023.    Surgical History: Past Surgical History:  Procedure Laterality Date   APPENDECTOMY     COLONOSCOPY WITH PROPOFOL N/A 08/17/2015   Procedure: COLONOSCOPY WITH PROPOFOL;  Surgeon: Christena Deem, MD;  Location: [facility] ENDOSCOPY;  Service: Endoscopy;  Laterality: N/A;   HERNIA REPAIR     PROSTATE BIOPSY     10/2016 negative multiple biopsies 2015 per note atypical cells unable to see this path report    Medical History: Past Medical History:  Diagnosis Date   Cancer (HCC)    skin bcc face s/p mohs unc-ch   Chicken pox    Elevated PSA    follows [facility]    GERD (gastroesophageal reflux disease)    Hyperlipidemia    Hypertension    PAF (paroxysmal atrial fibrillation) (HCC)    a. 11/2012-->CHA2DS2VASc = 2->Eliquis; b. 11/2012 Echo: EF 60-65%, nl RV fxn, mild MR. Mod elev RVSP.   Sleep apnea    Type 2 diabetes mellitus (HCC)     Family History:  Non contributory to the present illness  Social History: Social History   Socioeconomic History   Marital status: Married    Spouse name: Not on file   Number of children: Not on file   Years of education: Not on file   Highest education level: Not on file  Occupational History   Not on file  Tobacco Use   Smoking status: Never   Smokeless tobacco: Never  Vaping Use   Vaping status: Never Used  Substance and Sexual Activity   Alcohol use: Yes    Alcohol/week: 5.0 standard drinks of alcohol    Types: 5 Cans of beer per week    Comment: social drinker   Drug use: No   Sexual activity: Yes    Partners: Female  Other Topics  Concern   Not on file  Social History Narrative   Married    Works in Physicist, medical, 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 There is no height or weight on file to calculate BMI.    Examination: General Appearance: The patient is well-developed, well-nourished, and in no distress. Neck Circumference: 46cm Skin: Gross inspection of skin unremarkable. Head: normocephalic, no gross deformities. Eyes: no gross deformities noted. ENT: ears appear grossly normal Neurologic: Alert and oriented. No involuntary movements.  STOP BANG RISK ASSESSMENT S (snore) Have you been told that you snore?     NO   T (tired) Are you often tired, fatigued, or sleepy during the day?   YES  O (obstruction) Do you stop breathing, choke, or gasp during sleep? NO   P (pressure) Do you have or are you being treated for high blood pressure? YES/   B (BMI) Is your body index greater than 35 kg/m? YES   A (age) Are you 65 years old or older? YES   N (neck) Do you have a neck circumference greater than 16 inches?   NO   G (gender) Are you a male? YES   TOTAL STOP/BANG "YES" ANSWERS 5       A STOP-Bang score of 2 or less is considered low risk, and a score of 5 or more is high risk for having either moderate or severe OSA. For people who score 3 or 4, doctors may need to perform further assessment to determine how likely they are to have OSA.         EPWORTH SLEEPINESS SCALE:  Scale:  (0)= no chance of dozing; (1)= slight chance of dozing; (2)= moderate chance of dozing; (3)= high chance of dozing  Chance  Situtation     Sitting and reading: 0    Watching TV: 2    Sitting Inactive in public: 0    As a passenger in car: 0      Lying down to rest: 1    Sitting and talking: 0    Sitting quielty after lunch: 0    In a car, stopped in traffic: 0   TOTAL SCORE:   3 out of 24    SLEEP STUDIES:  Split study 12/16/2012 AHI 28/hr  Supine 71 hr, o2 desat to 89% with low of 82%    CPAP COMPLIANCE DATA:  Date Range: 01/09/23-02/09/23  Average Daily Use: 5 hours 55 mins   Median Use: 6 hrs 2 mins   Compliance for > 4 Hours: 100% days  AHI: 5.0 respiratory events per hour  Days Used: 32/32  Mask Leak: 90.8  95th Percentile Pressure: 11.5         LABS: No results found for this or any previous visit (from the past 2160 hour(s)).  Radiology: MR PROSTATE W WO CONTRAST  Result Date: 03/22/2018 CLINICAL DATA:  Elevated PSA, negative biopsy EXAM: MR PROSTATE WITHOUT AND WITH CONTRAST TECHNIQUE: Multiplanar multisequence MRI images were obtained of the pelvis centered about the prostate. Pre and post contrast images were obtained. CONTRAST:  20mL MULTIHANCE GADOBENATE DIMEGLUMINE 529 MG/ML IV SOLN COMPARISON:  None. FINDINGS: Prostate: No findings suspicious for high-grade macroscopic prostate cancer in the peripheral gland. Specifically, no focal low T2 lesion. No restricted diffusion/low ADC. No early arterial enhancement. Enlargement/nodularity central gland, indenting the base of the bladder, suggesting BPH. Associated exophytic central gland nodule along the right posterolateral aspect of the central gland near the base (series 7/image 7), overlying the base of the right seminal vesicle. No suspicious central gland nodule on T2. Volume: 3.3 x 5.1 x 4.6 cm (volume = 41 mL) Transcapsular spread:  Absent. Seminal vesicle involvement: Absent. Neurovascular bundle involvement: Absent. Pelvic adenopathy: Absent. Bone metastasis: Absent. Other findings: Bladder is mildly thick-walled although  underdistended. Tiny fat containing right inguinal hernia. Minimal fat within the left inguinal canal. IMPRESSION: No findings suspicious for high-grade macroscopic prostate cancer. Electronically Signed   By: Charline Bills M.D.   On: 03/22/2018 19:32    No results found.  No results found.    Assessment and Plan: Patient Active Problem List   Diagnosis Date Noted   Restless leg syndrome 08/12/2022   Hypercholesterolemia 05/12/2022   OSA (obstructive sleep apnea) 05/12/2022   CPAP use counseling 05/12/2022   Raised prostate specific antigen 11/25/2019   BPH (benign prostatic hyperplasia) 07/17/2018   History of skin cancer 02/15/2018   AK (actinic keratosis) 08/15/2017   History of basal cell carcinoma 08/15/2017   Vision abnormalities 03/14/2017   Elevated PSA 03/14/2017   Hyperlipidemia 08/28/2015   Dizziness 02/14/2014   Acid reflux 08/01/2013   Essential hypertension 08/01/2013   Benign essential hypertension 08/01/2013   Erectile dysfunction associated with type 2 diabetes mellitus (HCC) 05/31/2013   Type 2 diabetes mellitus (HCC) 05/31/2013   A-fib (HCC) 12/05/2012   Obesity 12/05/2012   Alcohol abuse 12/05/2012   Sleep apnea 12/05/2012   DM (diabetes mellitus), type 2 (HCC) 12/05/2012   Atrial fibrillation (HCC) 12/05/2012   1. OSA (obstructive sleep apnea) The patient does tolerate PAP and reports  benefit from PAP use. His leak is high, but his AHI is improved in control. The patient was reminded how to clean equipment and advised to replace supplies routinely. The patient was also counselled on weight loss. The compliance is excellent. The AHI is 5.0.   OSA on cpap- controlled. Continue with excellent compliance with pap. CPAP continues to be medically necessary to treat this patient's OSA. F/u one year.   2. CPAP use counseling CPAP Counseling: had a lengthy discussion with the patient regarding the importance of PAP therapy in management of the sleep apnea.  Patient appears to understand the risk factor reduction and also understands the risks associated with untreated sleep apnea. Patient  will try to make a good faith effort to remain compliant with therapy. Also instructed the patient on proper cleaning of the device including the water must be changed daily if possible and use of distilled water is preferred. Patient understands that the machine should be regularly cleaned with appropriate recommended cleaning solutions that do not damage the PAP machine for example given white vinegar and water rinses. Other methods such as ozone treatment may not be as good as these simple methods to achieve cleaning.   3. Benign essential hypertension Controlled with hydrochlorothiazide and valsartan and coreg. Continue.   4. Atrial fibrillation, unspecified type (HCC) On eliquis. Continue.      General Counseling: I have discussed the findings of the evaluation and examination with Jeovani.  I have also discussed any further diagnostic evaluation thatmay be needed or ordered today. Jaicion verbalizes understanding of the findings of todays visit. We also reviewed his medications today and discussed drug interactions and side effects including but not limited excessive drowsiness and altered mental states. We also discussed that there is always a risk not just to him but also people around him. he has been encouraged to call the office with any questions or concerns that should arise related to todays visit.  No orders of the defined types were placed in this encounter.       I have personally obtained a history, examined the patient, evaluated laboratory and imaging results, formulated the assessment and plan and placed orders. This patient was seen today by Emmaline Kluver, PA-C in collaboration with Dr. Freda Munro.   Yevonne Pax, MD [facility] Diplomate ABMS Pulmonary Critical Care Medicine and Sleep Medicine

## 2023-02-10 NOTE — Patient Instructions (Signed)

## 2023-10-26 NOTE — Progress Notes (Signed)
 [facility] [address] [place], [place] [address]  Pulmonary Sleep Medicine   Office Visit Note  Patient Name: Brandon [name] DOB: 11-05-1955 MRN [id number]    Chief Complaint: Obstructive Sleep Apnea visit  Brief History:  Brandon [name] is seen today for follow up for RLS and APAP @ 8-12cmH20. The patient has a 10 year history of sleep apnea. Patient *** using PAP nightly.  The patient feels *** after sleeping with PAP.  The patient reports *** from PAP use. Reported sleepiness is  *** and the Epworth Sleepiness Score is *** out of 24. The patient *** take naps. The patient complains of the following: ***  The compliance download shows 94% compliance with an average use time of 4 hours 55 minutes. The AHI is 5.7  The patient *** of limb movements disrupting sleep.  ROS  General: (-) fever, (-) chills, (-) night sweat Nose and Sinuses: (-) nasal stuffiness or itchiness, (-) postnasal drip, (-) nosebleeds, (-) sinus trouble. Mouth and Throat: (-) sore throat, (-) hoarseness. Neck: (-) swollen glands, (-) enlarged thyroid , (-) neck pain. Respiratory: *** cough, *** shortness of breath, *** wheezing. Neurologic: *** numbness, *** tingling. Psychiatric: *** anxiety, *** depression   Current Medication: Outpatient Encounter Medications as of 10/27/2023  Medication Sig   apixaban  (ELIQUIS ) 5 MG TABS tablet Take 1 tablet by mouth 2 (two) times daily.   Ascorbic Acid (VITAMIN C) 1000 MG tablet Take 1,000 mg by mouth daily.   carvedilol (COREG) 6.25 MG tablet Take 6.25 mg by mouth 2 (two) times daily.   CIALIS  5 MG tablet TAKE 1 TABLET BY MOUTH EVERY DAY AS NEEDED FOR ERECTILE DYSFUNCTION   CINNAMON PO Take by mouth 3 (three) times daily.   glucose blood (ACCU-CHEK AVIVA PLUS) test strip USE TWICE DAILY AS DIRECTED E 11.9   hydrochlorothiazide (MICROZIDE) 12.5 MG capsule Take 12.5 mg by mouth daily.   MAGNESIUM PO Take by mouth.   metFORMIN  (GLUCOPHAGE ) 500 MG tablet TAKE 1 TABLET  BY MOUTH TWICE DAILY WITH A MEAL   Multiple Vitamins-Minerals (MEGA BASIC PO) Take by mouth.   Omega-3 Fatty Acids (FISH OIL PO) Take by mouth daily.   rosuvastatin  (CRESTOR ) 5 MG tablet Take 1 tablet by mouth daily.   valsartan (DIOVAN) 160 MG tablet Take 1 tablet every day by oral route.   No facility-administered encounter medications on file as of 10/27/2023.    Surgical History: Past Surgical History:  Procedure Laterality Date   APPENDECTOMY     COLONOSCOPY WITH PROPOFOL  N/A 08/17/2015   Procedure: COLONOSCOPY WITH PROPOFOL ;  Surgeon: Gladis RAYMOND Mariner, MD;  Location: [facility] ENDOSCOPY;  Service: Endoscopy;  Laterality: N/A;   HERNIA REPAIR     PROSTATE BIOPSY     10/2016 negative multiple biopsies 2015 per note atypical cells unable to see this path report    Medical History: Past Medical History:  Diagnosis Date   Cancer (HCC)    skin bcc face s/p mohs unc-ch   Chicken pox    Elevated PSA    follows [facility]    GERD (gastroesophageal reflux disease)    Hyperlipidemia    Hypertension    PAF (paroxysmal atrial fibrillation) (HCC)    a. 11/2012-->CHA2DS2VASc = 2->Eliquis ; b. 11/2012 Echo: EF 60-65%, nl RV fxn, mild MR. Mod elev RVSP.   Sleep apnea    Type 2 diabetes mellitus (HCC)     Family History: Non contributory to the present illness  Social History: Social History  Socioeconomic History   Marital status: Married    Spouse name: Not on file   Number of children: Not on file   Years of education: Not on file   Highest education level: Not on file  Occupational History   Not on file  Tobacco Use   Smoking status: Never   Smokeless tobacco: Never  Vaping Use   Vaping status: Never Used  Substance and Sexual Activity   Alcohol use: Yes    Alcohol/week: 5.0 standard drinks of alcohol    Types: 5 Cans of beer per week    Comment: social drinker   Drug use: No   Sexual activity: Yes    Partners: Female  Other Topics Concern   Not on file   Social History Narrative   Married    Works in Physicist, medical, 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 There is no height or weight on file to calculate BMI.    Examination: General Appearance: The patient is well-developed, well-nourished, and in no distress. Neck Circumference: *** Skin: Gross inspection of skin unremarkable. Head: normocephalic, no gross deformities. Eyes: no gross deformities noted. ENT: ears appear grossly normal Neurologic: Alert and oriented. No involuntary movements.  STOP BANG RISK ASSESSMENT S (snore) Have you been told that you snore?     YES/NO   T (tired) Are you often tired, fatigued, or sleepy during the day?   YES/NO  O (obstruction) Do you stop breathing, choke, or gasp during sleep? YES/NO   P (pressure) Do you have or are you being treated for high blood pressure? YES/NO   B (BMI) Is your body index greater than 35 kg/m? YES/NO   A (age) Are you 22 years old or older? YES   N (neck) Do you have a neck circumference greater than 16 inches?   YES/NO   G (gender) Are you a male? YES   TOTAL STOP/BANG "YES" ANSWERS        A STOP-Bang score of 2 or less is considered low risk, and a score of 5 or more is high risk for having either moderate or severe OSA. For people who score 3 or 4, doctors may need to perform further assessment to determine how likely they are to have OSA.         EPWORTH SLEEPINESS SCALE:  Scale:  (0)= no chance of dozing; (1)= slight chance of dozing; (2)= moderate chance of dozing; (3)= high chance of dozing  Chance  Situtation    Sitting and  reading: ***    Watching TV: ***    Sitting Inactive in public: ***    As a passenger in car: ***      Lying down to rest: ***    Sitting and talking: ***    Sitting quielty after lunch: ***    In a car, stopped in traffic: ***   TOTAL SCORE:   *** out of 24    SLEEP STUDIES:  Split study 12/16/2012 AHI 28/hr Supine 71 hr, o2 desat to 89% with low of 82% CPAP @  8cmH20   CPAP COMPLIANCE DATA:  Date Range: 07/27/23 - 10/24/23  Average Daily Use: 4 hours 55 minutes  Median Use: 5 hours 1 minute  Compliance for > 4 Hours: 94% days  AHI: 5.7 respiratory events per hour  Days Used: 90/90  Mask Leak: 64.4  95th Percentile Pressure: 11.4cmH20         LABS: No results found for this or any previous visit (from the past 2160 hours).  Radiology: MR PROSTATE W WO CONTRAST Result Date: 03/22/2018 CLINICAL DATA:  Elevated PSA, negative biopsy EXAM: MR PROSTATE WITHOUT AND WITH CONTRAST TECHNIQUE: Multiplanar multisequence MRI images were obtained of the pelvis centered about the prostate. Pre and post contrast images were obtained. CONTRAST:  20mL MULTIHANCE  GADOBENATE DIMEGLUMINE  529 MG/ML IV SOLN COMPARISON:  None. FINDINGS: Prostate: No findings suspicious for high-grade macroscopic prostate cancer in the peripheral gland. Specifically, no focal low T2 lesion. No restricted diffusion/low ADC. No early arterial enhancement. Enlargement/nodularity central gland, indenting the base of the bladder, suggesting BPH. Associated exophytic central gland nodule along the right posterolateral aspect of the central gland near the base (series 7/image 7), overlying the base of the right seminal vesicle. No suspicious central gland nodule on T2. Volume: 3.3 x 5.1 x 4.6 cm (volume = 41 mL) Transcapsular spread:  Absent. Seminal vesicle involvement: Absent. Neurovascular bundle involvement: Absent. Pelvic adenopathy: Absent. Bone metastasis: Absent. Other findings: Bladder is mildly  thick-walled although underdistended. Tiny fat containing right inguinal hernia. Minimal fat within the left inguinal canal. IMPRESSION: No findings suspicious for high-grade macroscopic prostate cancer. Electronically Signed   By: Pinkie Pebbles M.D.   On: 03/22/2018 19:32    No results found.  No results found.    Assessment and Plan: Patient Active Problem List   Diagnosis Date Noted   Restless leg syndrome 08/12/2022   Hypercholesterolemia 05/12/2022   OSA (obstructive sleep apnea) 05/12/2022   CPAP use counseling 05/12/2022   Raised prostate specific antigen 11/25/2019   BPH (benign prostatic hyperplasia) 07/17/2018   History of skin cancer 02/15/2018   AK (actinic keratosis) 08/15/2017   History of basal cell carcinoma 08/15/2017   Vision abnormalities 03/14/2017   Elevated PSA 03/14/2017   Hyperlipidemia 08/28/2015   Dizziness 02/14/2014   Acid reflux 08/01/2013   Essential hypertension 08/01/2013   Benign essential hypertension 08/01/2013   Erectile dysfunction associated with type 2 diabetes mellitus (HCC) 05/31/2013   Type 2 diabetes mellitus (HCC) 05/31/2013   A-fib (HCC) 12/05/2012   Obesity 12/05/2012   Alcohol abuse 12/05/2012   Sleep apnea 12/05/2012   DM (diabetes mellitus), type 2 (HCC) 12/05/2012   Atrial fibrillation (HCC) 12/05/2012      The patient *** tolerate PAP and reports *** benefit from PAP use. The patient was reminded how to *** and advised to ***. The patient was also counselled on ***. The compliance is ***. The AHI is ***.   ***  General Counseling: I have discussed the findings of the evaluation and examination with Kazimir.  I have also discussed any further diagnostic evaluation thatmay be needed or ordered today. Ifeanyichukwu verbalizes understanding of the findings of todays visit. We also reviewed his medications today and discussed drug interactions and side effects including but not limited excessive drowsiness and altered mental states. We  also discussed that there is always a risk not just to him but also people around him. he has been encouraged to call the office with any questions or concerns that should arise related to  todays visit.  No orders of the defined types were placed in this encounter.       I have personally obtained a history, examined the patient, evaluated laboratory and imaging results, formulated the assessment and plan and placed orders.  Elfreda DELENA Bathe, MD [facility] Diplomate ABMS Pulmonary Critical Care Medicine and Sleep Medicine

## 2023-10-27 ENCOUNTER — Ambulatory Visit (INDEPENDENT_AMBULATORY_CARE_PROVIDER_SITE_OTHER): Admitting: Internal Medicine

## 2023-10-27 DIAGNOSIS — E611 Iron deficiency: Secondary | ICD-10-CM

## 2023-10-27 DIAGNOSIS — G4733 Obstructive sleep apnea (adult) (pediatric): Secondary | ICD-10-CM

## 2023-10-27 DIAGNOSIS — Z7189 Other specified counseling: Secondary | ICD-10-CM

## 2023-10-27 DIAGNOSIS — G2581 Restless legs syndrome: Secondary | ICD-10-CM

## 2023-10-27 MED ORDER — GABAPENTIN 100 MG PO CAPS: 100.0000 mg | ORAL_CAPSULE | Freq: Three times a day (TID) | ORAL | 3 refills | Status: DC

## 2023-10-27 NOTE — Patient Instructions (Signed)
 Restless Legs Syndrome: What to Know Restless legs syndrome (RLS) is a condition that makes your legs feel uncomfortable, especially while sitting or lying down. This discomfort makes you want to move your legs. Sometimes, your arms can feel this way too. RLS can be mild or severe. It can make it hard for you to sleep. What are the causes? The cause of RLS isn't known. What increases the risk? You're more likely to get RLS if: You're older than 66 years of age. You're pregnant. You're male. Someone in your family has RLS. You don't have enough iron in your body. You smoke, drink alcohol, or drink too much caffeine. You have certain health problems like kidney disease, Parkinson's disease, or nerve damage. You take certain medicines for high blood pressure, feeling like you may throw up (nausea), colds, allergies, depression, or heart problems. What are the signs or symptoms? The main symptom of RLS is uncomfortable feelings in the legs, such as: Pulling. Tingling. Prickling. Throbbing. Crawling. Burning. Usually, the feelings: Happen on both sides of your body. Get worse when you sit or lie down. Get worse at night, making it hard to sleep. Make you want to move your legs. Feel better when you move your legs or stand up. Sometimes, your arms can feel this way too. People with RLS often feel tired during the day because they don't sleep well at night. How is this diagnosed? RLS may be diagnosed based on: Your symptoms. Blood tests. In some cases, you may be monitored in a sleep lab by a specialist. This is called a sleep study. It can help find out if something is stopping you from sleeping well. How is this treated? RLS is treated by managing the symptoms. This may include: Lifestyle changes, such as exercising, using relaxation techniques, and avoiding caffeine, alcohol, or tobacco. Iron supplements. Medicines. Medicines for Parkinson's disease may be tried first. Medicines  to prevent seizures may also help. Special foot wraps made for people with RLS. Vibration pads applied to the back of your legs. A device worn on the lower legs that makes the muscles move. This helps lessen the uncomfortable feelings from RLS. Follow these instructions at home: Lifestyle  Use good sleep habits. Go to bed and wake up at the same time every day. Most adults need 7-9 hours of sleep each night. Get regular exercise. Try to get at least 30 minutes of exercise most days of the week. Find ways to relax, like deep breathing, yoga, or meditation. Avoid caffeine and alcohol. Do not smoke, vape, or use nicotine or tobacco. General instructions Take your medicines only as told. Try these methods to relieve the discomfort in your legs: Massage your legs. Walk or stretch. Take a cold or warm bath. Keep all follow-up visits. Your health care provider will check if the treatment is working. Where to find more information To learn more: Go to Basic[employer]no. Click Search and type restless legs syndrome. Find the link you need. Contact a health care provider if: Your symptoms get worse. Your symptoms don't get better with treatment. This information is not intended to replace advice given to you by your health care provider. Make sure you discuss any questions you have with your health care provider. Document Revised: 06/13/2023 Document Reviewed: 06/13/2023 Elsevier Patient Education  2025 [employer].

## 2023-11-04 LAB — IRON,TIBC AND FERRITIN PANEL
Ferritin: 86 ng/mL (ref 30–400)
Iron Saturation: 23 % (ref 15–55)
Iron: 67 ug/dL (ref 38–169)
Total Iron Binding Capacity: 295 ug/dL (ref 250–450)
UIBC: 228 ug/dL (ref 111–343)

## 2023-12-28 NOTE — Progress Notes (Signed)
 [facility] [address] [place], [place] [address]  Pulmonary Sleep Medicine   Office Visit Note  Patient Name: Brandon [name] DOB: 1955-04-21 MRN [id number]    Chief Complaint: Obstructive Sleep Apnea visit  Brief History:  Sharon is seen today for a follow up on RLS and APAP @ 6-12 cmH2O.  The patient has a 11 year history of sleep apnea. Patient is using PAP nightly.  The patient feels somewhat rested after sleeping with PAP.  The patient reports benefit from PAP use. Reported sleepiness is  improved and the Epworth Sleepiness Score is 2 out of 24. The patient will take an occasional nap. The patient complains of the following: none at this time.  The compliance download shows 94% compliance with an average use time of 5 hours 7 minutes. The AHI is 4.9  The patient does complain of limb movements disrupting sleep. He is currently taking Gabapentin  100mg  three times a day. If he takes a dose at lunch time he will fall asleep, this is the only thing he experiences. He has changed  the way he takes his prescription due to this issue. He will take 100mg  at supper time and then 200mg  at bedtime. The patient states his RLS has improved and is now tolerable.  ROS  General: (-) fever, (-) chills, (-) night sweat Nose and Sinuses: (-) nasal stuffiness or itchiness, (-) postnasal drip, (-) nosebleeds, (-) sinus trouble. Mouth and Throat: (-) sore throat, (-) hoarseness. Neck: (-) swollen glands, (-) enlarged thyroid , (-) neck pain. Respiratory: - cough, - shortness of breath, - wheezing. Neurologic: - numbness, - tingling. Psychiatric: - anxiety, - depression   Current Medication: Outpatient Encounter Medications as of 12/29/2023  Medication Sig   apixaban  (ELIQUIS ) 5 MG TABS tablet Take 1 tablet by mouth 2 (two) times daily.   Ascorbic Acid (VITAMIN C) 1000 MG tablet Take 1,000 mg by mouth daily.   carvedilol (COREG) 3.125 MG tablet Take 3.125 mg by mouth 2 (two) times daily.    CIALIS  5 MG tablet TAKE 1 TABLET BY MOUTH EVERY DAY AS NEEDED FOR ERECTILE DYSFUNCTION   CINNAMON PO Take by mouth 3 (three) times daily.   gabapentin  (NEURONTIN ) 100 MG capsule Take 1 capsule (100 mg total) by mouth 3 (three) times daily.   glucose blood (ACCU-CHEK AVIVA PLUS) test strip USE TWICE DAILY AS DIRECTED E 11.9   hydrochlorothiazide (MICROZIDE) 12.5 MG capsule Take 12.5 mg by mouth daily.   MAGNESIUM PO Take by mouth.   metFORMIN  (GLUCOPHAGE ) 500 MG tablet TAKE 1 TABLET BY MOUTH TWICE DAILY WITH A MEAL   Multiple Vitamins-Minerals (MEGA BASIC PO) Take by mouth.   Omega-3 Fatty Acids (FISH OIL PO) Take by mouth daily.   rosuvastatin  (CRESTOR ) 10 MG tablet 1 mg every day by oral route.   valsartan (DIOVAN) 80 MG tablet Take 80 mg by mouth daily.   [DISCONTINUED] carvedilol (COREG) 6.25 MG tablet Take 6.25 mg by mouth 2 (two) times daily.   No facility-administered encounter medications on file as of 12/29/2023.    Surgical History: Past Surgical History:  Procedure Laterality Date   APPENDECTOMY     COLONOSCOPY WITH PROPOFOL  N/A 08/17/2015   Procedure: COLONOSCOPY WITH PROPOFOL ;  Surgeon: Gladis RAYMOND Mariner, MD;  Location: [facility] ENDOSCOPY;  Service: Endoscopy;  Laterality: N/A;   HERNIA REPAIR     PROSTATE BIOPSY     10/2016 negative multiple biopsies 2015 per note atypical cells unable to see this path report  Medical History: Past Medical History:  Diagnosis Date   Cancer (HCC)    skin bcc face s/p mohs unc-ch   Chicken pox    Elevated PSA    follows [facility]    GERD (gastroesophageal reflux disease)    Hyperlipidemia    Hypertension    PAF (paroxysmal atrial fibrillation) (HCC)    a. 11/2012-->CHA2DS2VASc = 2->Eliquis ; b. 11/2012 Echo: EF 60-65%, nl RV fxn, mild MR. Mod elev RVSP.   Sleep apnea    Type 2 diabetes mellitus (HCC)     Family History: Non contributory to the present illness  Social History: Social History   Socioeconomic History    Marital status: Married    Spouse name: Not on file   Number of children: Not on file   Years of education: Not on file   Highest education level: Not on file  Occupational History   Not on file  Tobacco Use   Smoking status: Never   Smokeless tobacco: Never  Vaping Use   Vaping status: Never Used  Substance and Sexual Activity   Alcohol use: Yes    Alcohol/week: 5.0 standard drinks of alcohol    Types: 5 Cans of beer per week    Comment: social drinker   Drug use: No   Sexual activity: Yes    Partners: Female  Other Topics Concern   Not on file  Social History Narrative   Married    Works in Physicist, medical, 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 and will move to [facility] [place]   never smoker    Social Drivers of Corporate investment banker Strain: Not on file  Food Insecurity: Not on file  Transportation Needs: Not on file  Physical Activity: Not on file  Stress: Not on file  Social Connections: Not on file  Intimate Partner Violence: Not At Risk (12/05/2023)   Received from [facility]   Humiliation, Afraid, Rape, and Kick questionnaire    Within the last year, have you been afraid of your partner or ex-partner?: No    Within the last year, have you been humiliated or emotionally abused in other ways by your partner or ex-partner?: No    Within the last year, have you been kicked, hit, slapped, or otherwise physically hurt by your partner or ex-partner?: No    Within the last year, have you been raped or forced to have any kind of sexual activity by your partner or ex-partner?: No    Vital Signs: Blood pressure 136/87, pulse 73, resp. rate 16, height 6' (1.829 m), weight 230 lb (104.3 kg), SpO2 96%. Body mass index is 31.19 kg/m.    Examination: General Appearance: The patient is well-developed, well-nourished, and in no distress. Neck Circumference: 45.5cm Skin: Gross inspection of skin unremarkable. Head: normocephalic, no gross  deformities. Eyes: no gross deformities noted. ENT: ears appear grossly normal Neurologic: Alert and oriented. No involuntary movements.  STOP BANG RISK ASSESSMENT S (snore) Have you been told that you snore?     NO   T (tired) Are you often tired, fatigued, or sleepy during the day?   NO  O (obstruction) Do you stop breathing, choke, or gasp during sleep? NO   P (pressure) Do you have or are you being treated for high blood pressure? YES   B (BMI) Is your body index greater than 35 kg/m? NO   A (age) Are you 21 years old or older? YES   N (neck) Do you  have a neck circumference greater than 16 inches?   YES   G (gender) Are you a male? YES   TOTAL STOP/BANG "YES" ANSWERS 4       A STOP-Bang score of 2 or less is considered low risk, and a score of 5 or more is high risk for having either moderate or severe OSA. For people who score 3 or 4, doctors may need to perform further assessment to determine how likely they are to have OSA.         EPWORTH SLEEPINESS SCALE:  Scale:  (0)= no chance of dozing; (1)= slight chance of dozing; (2)= moderate chance of dozing; (3)= high chance of dozing  Chance  Situtation    Sitting and reading: 0    Watching TV: 1    Sitting Inactive in public: 0    As a passenger in car: 0      Lying down to rest: 1    Sitting and talking: 0    Sitting quielty after lunch: 0    In a car, stopped in traffic: 0   TOTAL SCORE:   2 out of 24    SLEEP STUDIES: Split Study (12/16/2012) AHI 28/hr, Supine 71/hr, O2 desat to 89% with low of 82%   CPAP COMPLIANCE DATA:  Date Range: 10/25/2023 - 12/25/2023  Average Daily Use: 5 hours 7 minutes  Median Use: 5 hours 21 minutes  Compliance for > 4 Hours: 94% days  AHI: 4.9 respiratory events per hour  Days Used: 62/62  Mask Leak: 68.9/hr  95th Percentile Pressure: 11.7 cmH2O         LABS: Recent Results (from the past 2160 hours)  Fe+TIBC+Fer     Status: None   Collection  Time: 11/03/23 10:37 AM  Result Value Ref Range   Total Iron Binding Capacity 295 250 - 450 ug/dL   UIBC 771 888 - 656 ug/dL   Iron 67 38 - 830 ug/dL   Iron Saturation 23 15 - 55 %   Ferritin 86 30 - 400 ng/mL    Radiology: MR PROSTATE W WO CONTRAST Result Date: 03/22/2018 CLINICAL DATA:  Elevated PSA, negative biopsy EXAM: MR PROSTATE WITHOUT AND WITH CONTRAST TECHNIQUE: Multiplanar multisequence MRI images were obtained of the pelvis centered about the prostate. Pre and post contrast images were obtained. CONTRAST:  20mL MULTIHANCE  GADOBENATE DIMEGLUMINE  529 MG/ML IV SOLN COMPARISON:  None. FINDINGS: Prostate: No findings suspicious for high-grade macroscopic prostate cancer in the peripheral gland. Specifically, no focal low T2 lesion. No restricted diffusion/low ADC. No early arterial enhancement. Enlargement/nodularity central gland, indenting the base of the bladder, suggesting BPH. Associated exophytic central gland nodule along the right posterolateral aspect of the central gland near the base (series 7/image 7), overlying the base of the right seminal vesicle. No suspicious central gland nodule on T2. Volume: 3.3 x 5.1 x 4.6 cm (volume = 41 mL) Transcapsular spread:  Absent. Seminal vesicle involvement: Absent. Neurovascular bundle involvement: Absent. Pelvic adenopathy: Absent. Bone metastasis: Absent. Other findings: Bladder is mildly thick-walled although underdistended. Tiny fat containing right inguinal hernia. Minimal fat within the left inguinal canal. IMPRESSION: No findings suspicious for high-grade macroscopic prostate cancer. Electronically Signed   By: Pinkie Pebbles M.D.   On: 03/22/2018 19:32    No results found.  No results found.    Assessment and Plan: Patient Active Problem List   Diagnosis Date Noted   Restless leg syndrome 08/12/2022   Hypercholesterolemia 05/12/2022   OSA (obstructive sleep  apnea) 05/12/2022   CPAP use counseling 05/12/2022   Raised  prostate specific antigen 11/25/2019   BPH (benign prostatic hyperplasia) 07/17/2018   History of skin cancer 02/15/2018   AK (actinic keratosis) 08/15/2017   History of basal cell carcinoma 08/15/2017   Vision abnormalities 03/14/2017   Elevated PSA 03/14/2017   Hyperlipidemia 08/28/2015   Dizziness 02/14/2014   Acid reflux 08/01/2013   Essential hypertension 08/01/2013   Benign essential hypertension 08/01/2013   Erectile dysfunction associated with type 2 diabetes mellitus (HCC) 05/31/2013   Type 2 diabetes mellitus (HCC) 05/31/2013   A-fib (HCC) 12/05/2012   Obesity 12/05/2012   Alcohol abuse 12/05/2012   Sleep apnea 12/05/2012   DM (diabetes mellitus), type 2 (HCC) 12/05/2012   Atrial fibrillation (HCC) 12/05/2012    1. OSA (obstructive sleep apnea) (Primary) The patient does tolerate PAP and reports  benefit from PAP use. The patient was reminded how to clean equipment and advised to replace supplies routinely. The patient was also counselled on weight loss. The compliance is good. The AHI is 4.9 which is improved since we added the gabapentin .    OSA on cpap- controlled. Continue with compliance with pap. CPAP continues to be medically necessary to treat this patient's OSA. F/u one year.    2. CPAP use counseling CPAP Counseling: had a lengthy discussion with the patient regarding the importance of PAP therapy in management of the sleep apnea. Patient appears to understand the risk factor reduction and also understands the risks associated with untreated sleep apnea. Patient will try to make a good faith effort to remain compliant with therapy. Also instructed the patient on proper cleaning of the device including the water must be changed daily if possible and use of distilled water is preferred. Patient understands that the machine should be regularly cleaned with appropriate recommended cleaning solutions that do not damage the PAP machine for example given white vinegar and  water rinses. Other methods such as ozone treatment may not be as good as these simple methods to achieve cleaning.   3. Restless leg syndrome Improved with gabapentin . Does best when he takes 100 mg at supper and 200 mg before bed.     General Counseling: I have discussed the findings of the evaluation and examination with Tykel.  I have also discussed any further diagnostic evaluation thatmay be needed or ordered today. Teigan verbalizes understanding of the findings of todays visit. We also reviewed his medications today and discussed drug interactions and side effects including but not limited excessive drowsiness and altered mental states. We also discussed that there is always a risk not just to him but also people around him. he has been encouraged to call the office with any questions or concerns that should arise related to todays visit.  No orders of the defined types were placed in this encounter.       I have personally obtained a history, examined the patient, evaluated laboratory and imaging results, formulated the assessment and plan and placed orders. This patient was seen today by Lauraine Lay, PA-C in collaboration with Dr. Elfreda Bathe.   Elfreda DELENA Bathe, MD [facility] Diplomate ABMS Pulmonary Critical Care Medicine and Sleep Medicine

## 2023-12-29 ENCOUNTER — Ambulatory Visit (INDEPENDENT_AMBULATORY_CARE_PROVIDER_SITE_OTHER): Admitting: Internal Medicine

## 2023-12-29 VITALS — BP 136/87 | HR 73 | Resp 16 | Ht 72.0 in | Wt 230.0 lb

## 2023-12-29 DIAGNOSIS — G2581 Restless legs syndrome: Secondary | ICD-10-CM | POA: Diagnosis not present

## 2023-12-29 DIAGNOSIS — G4733 Obstructive sleep apnea (adult) (pediatric): Secondary | ICD-10-CM | POA: Diagnosis not present

## 2023-12-29 DIAGNOSIS — Z7189 Other specified counseling: Secondary | ICD-10-CM | POA: Diagnosis not present

## 2023-12-29 MED ORDER — GABAPENTIN 100 MG PO CAPS: ORAL_CAPSULE | ORAL | 3 refills | Status: AC

## 2023-12-29 NOTE — Patient Instructions (Signed)
